# Patient Record
Sex: Male | Born: 1935 | Race: White | Hispanic: No | Smoking: Never smoker
Health system: Southern US, Community
[De-identification: ages and names within clinical notes are randomized; demographics above are authoritative.]

## PROBLEM LIST (undated history)

## (undated) DIAGNOSIS — H9190 Unspecified hearing loss, unspecified ear: Secondary | ICD-10-CM

## (undated) DIAGNOSIS — F0390 Unspecified dementia without behavioral disturbance: Secondary | ICD-10-CM

## (undated) DIAGNOSIS — N185 Chronic kidney disease, stage 5: Secondary | ICD-10-CM

## (undated) HISTORY — PX: EYE SURGERY: SHX253

---

## 2013-08-19 ENCOUNTER — Emergency Department (HOSPITAL_COMMUNITY): Payer: Medicare Other

## 2013-08-19 ENCOUNTER — Encounter (HOSPITAL_COMMUNITY): Payer: Self-pay | Admitting: Emergency Medicine

## 2013-08-19 ENCOUNTER — Emergency Department (INDEPENDENT_AMBULATORY_CARE_PROVIDER_SITE_OTHER)
Admission: EM | Admit: 2013-08-19 | Discharge: 2013-08-19 | Disposition: A | Discharge status: Home / Self Care | Payer: Medicare Other | Source: Home / Self Care | Attending: Emergency Medicine | Admitting: Emergency Medicine

## 2013-08-19 ENCOUNTER — Emergency Department (HOSPITAL_COMMUNITY)
Admission: EM | Admit: 2013-08-19 | Discharge: 2013-08-19 | Disposition: A | Discharge status: Home / Self Care | Payer: Medicare Other | Attending: Emergency Medicine | Admitting: Emergency Medicine

## 2013-08-19 DIAGNOSIS — N189 Chronic kidney disease, unspecified: Secondary | ICD-10-CM | POA: Insufficient documentation

## 2013-08-19 DIAGNOSIS — R799 Abnormal finding of blood chemistry, unspecified: Secondary | ICD-10-CM

## 2013-08-19 DIAGNOSIS — R7989 Other specified abnormal findings of blood chemistry: Secondary | ICD-10-CM

## 2013-08-19 DIAGNOSIS — R42 Dizziness and giddiness: Secondary | ICD-10-CM

## 2013-08-19 DIAGNOSIS — Z79899 Other long term (current) drug therapy: Secondary | ICD-10-CM | POA: Insufficient documentation

## 2013-08-19 LAB — CBC WITH DIFFERENTIAL/PLATELET
BASOS ABS: 0 10*3/uL (ref 0.0–0.1)
BASOS PCT: 0 % (ref 0–1)
EOS ABS: 0.1 10*3/uL (ref 0.0–0.7)
EOS PCT: 1 % (ref 0–5)
HCT: 41.5 % (ref 39.0–52.0)
Hemoglobin: 13.6 g/dL (ref 13.0–17.0)
Lymphocytes Relative: 14 % (ref 12–46)
Lymphs Abs: 1.1 10*3/uL (ref 0.7–4.0)
MCH: 31.6 pg (ref 26.0–34.0)
MCHC: 32.8 g/dL (ref 30.0–36.0)
MCV: 96.3 fL (ref 78.0–100.0)
Monocytes Absolute: 0.6 10*3/uL (ref 0.1–1.0)
Monocytes Relative: 7 % (ref 3–12)
NEUTROS PCT: 78 % — AB (ref 43–77)
Neutro Abs: 5.9 10*3/uL (ref 1.7–7.7)
PLATELETS: 202 10*3/uL (ref 150–400)
RBC: 4.31 MIL/uL (ref 4.22–5.81)
RDW: 12.5 % (ref 11.5–15.5)
WBC: 7.6 10*3/uL (ref 4.0–10.5)

## 2013-08-19 LAB — URINALYSIS, ROUTINE W REFLEX MICROSCOPIC
Bilirubin Urine: NEGATIVE
GLUCOSE, UA: NEGATIVE mg/dL
HGB URINE DIPSTICK: NEGATIVE
KETONES UR: NEGATIVE mg/dL
LEUKOCYTES UA: NEGATIVE
Nitrite: NEGATIVE
PROTEIN: NEGATIVE mg/dL
Specific Gravity, Urine: 1.012 (ref 1.005–1.030)
Urobilinogen, UA: 0.2 mg/dL (ref 0.0–1.0)
pH: 7 (ref 5.0–8.0)

## 2013-08-19 LAB — BASIC METABOLIC PANEL
BUN: 39 mg/dL — ABNORMAL HIGH (ref 6–23)
CO2: 28 mEq/L (ref 19–32)
Calcium: 9.6 mg/dL (ref 8.4–10.5)
Chloride: 102 mEq/L (ref 96–112)
Creatinine, Ser: 2.22 mg/dL — ABNORMAL HIGH (ref 0.50–1.35)
GFR, EST AFRICAN AMERICAN: 29 mL/min — AB (ref >90)
GFR, EST NON AFRICAN AMERICAN: 25 mL/min — AB (ref >90)
Glucose, Bld: 116 mg/dL — ABNORMAL HIGH (ref 70–99)
POTASSIUM: 5.2 meq/L (ref 3.7–5.3)
SODIUM: 142 meq/L (ref 137–147)

## 2013-08-19 LAB — TROPONIN I: Troponin I: <0.3 ng/mL (ref <0.30)

## 2013-08-19 LAB — POCT I-STAT, CHEM 8
BUN: 37 mg/dL — AB (ref 6–23)
CALCIUM ION: 1.2 mmol/L (ref 1.13–1.30)
CREATININE: 2.4 mg/dL — AB (ref 0.50–1.35)
Chloride: 103 mEq/L (ref 96–112)
GLUCOSE: 131 mg/dL — AB (ref 70–99)
HCT: 47 % (ref 39.0–52.0)
HEMOGLOBIN: 16 g/dL (ref 13.0–17.0)
Potassium: 4.5 mEq/L (ref 3.7–5.3)
Sodium: 144 mEq/L (ref 137–147)
TCO2: 29 mmol/L (ref 0–100)

## 2013-08-19 NOTE — ED Notes (Signed)
Pt c/o dizzy onset 1 week Denies cold sx, f/v/n/d, SOB, weakness, CP Voices no other concerns Alert and talking in complete sentences w/no signs of acute distress.

## 2013-08-19 NOTE — Discharge Instructions (Signed)
Dizziness Dizziness is a common problem. It is a feeling of unsteadiness or lightheadedness. You may feel like you are about to faint. Dizziness can lead to injury if you stumble or fall. A person of any age group can suffer from dizziness, but dizziness is more common in older adults. CAUSES  Dizziness can be caused by many different things, including:  Middle ear problems.  Standing for too long.  Infections.  An allergic reaction.  Aging.  An emotional response to something, such as the sight of blood.  Side effects of medicines.  Fatigue.  Problems with circulation or blood pressure.  Excess use of alcohol, medicines, or illegal drug use.  Breathing too fast (hyperventilation).  An arrhythmia or problems with your heart rhythm.  Low red blood cell count (anemia).  Pregnancy.  Vomiting, diarrhea, fever, or other illnesses that cause dehydration.  Diseases or conditions such as Parkinson's disease, high blood pressure (hypertension), diabetes, and thyroid problems.  Exposure to extreme heat. DIAGNOSIS  To find the cause of your dizziness, your caregiver may do a physical exam, lab tests, radiologic imaging scans, or an electrocardiography test (ECG).  TREATMENT  Treatment of dizziness depends on the cause of your symptoms and can vary greatly. HOME CARE INSTRUCTIONS   Drink enough fluids to keep your urine clear or pale yellow. This is especially important in very hot weather. In the elderly, it is also important in cold weather.  If your dizziness is caused by medicines, take them exactly as directed. When taking blood pressure medicines, it is especially important to get up slowly.  Rise slowly from chairs and steady yourself until you feel okay.  In the morning, first sit up on the side of the bed. When this seems okay, stand slowly while holding onto something until you know your balance is fine.  If you need to stand in one place for a long time, be sure to  move your legs often. Tighten and relax the muscles in your legs while standing.  If dizziness continues to be a problem, have someone stay with you for a day or two. Do this until you feel you are well enough to stay alone. Have the person call your caregiver if he or she notices changes in you that are concerning.  Do not drive or use heavy machinery if you feel dizzy.  Do not drink alcohol. SEEK IMMEDIATE MEDICAL CARE IF:   Your dizziness or lightheadedness gets worse.  You feel nauseous or vomit.  You develop problems with talking, walking, weakness, or using your arms, hands, or legs.  You are not thinking clearly or you have difficulty forming sentences. It may take a friend or family member to determine if your thinking is normal.  You develop chest pain, abdominal pain, shortness of breath, or sweating.  Your vision changes.  You notice any bleeding.  You have side effects from medicine that seems to be getting worse rather than better. MAKE SURE YOU:   Understand these instructions.  Will watch your condition.  Will get help right away if you are not doing well or get worse. Document Released: 09/16/2000 Document Revised: 06/15/2011 Document Reviewed: 10/10/2010 [facility] Patient Information 2014 [place], [place].  Benign Positional Vertigo Vertigo means you feel like you or your surroundings are moving when they are not. Benign positional vertigo is the most common form of vertigo. Benign means that the cause of your condition is not serious. Benign positional vertigo is more common in older adults. CAUSES  Benign positional vertigo is the result of an upset in the labyrinth system. This is an area in the middle ear that helps control your balance. This may be caused by a viral infection, head injury, or repetitive motion. However, often no specific cause is found. SYMPTOMS  Symptoms of benign positional vertigo occur when you move your head or eyes in different  directions. Some of the symptoms may include:  Loss of balance and falls.  Vomiting.  Blurred vision.  Dizziness.  Nausea.  Involuntary eye movements (nystagmus). DIAGNOSIS  Benign positional vertigo is usually diagnosed by physical exam. If the specific cause of your benign positional vertigo is unknown, your caregiver may perform imaging tests, such as magnetic resonance imaging (MRI) or computed tomography (CT). TREATMENT  Your caregiver may recommend movements or procedures to correct the benign positional vertigo. Medicines such as meclizine, benzodiazepines, and medicines for nausea may be used to treat your symptoms. In rare cases, if your symptoms are caused by certain conditions that affect the inner ear, you may need surgery. HOME CARE INSTRUCTIONS   Follow your caregiver's instructions.  Move slowly. Do not make sudden body or head movements.  Avoid driving.  Avoid operating heavy machinery.  Avoid performing any tasks that would be dangerous to you or others during a vertigo episode.  Drink enough fluids to keep your urine clear or pale yellow. SEEK IMMEDIATE MEDICAL CARE IF:   You develop problems with walking, weakness, numbness, or using your arms, hands, or legs.  You have difficulty speaking.  You develop severe headaches.  Your nausea or vomiting continues or gets worse.  You develop visual changes.  Your family or friends notice any behavioral changes.  Your condition gets worse.  You have a fever.  You develop a stiff neck or sensitivity to light. MAKE SURE YOU:   Understand these instructions.  Will watch your condition.  Will get help right away if you are not doing well or get worse. Document Released: 12/29/2005 Document Revised: 06/15/2011 Document Reviewed: 12/11/2010 [facility] Patient Information 2014 [place].  Chronic Kidney Disease Chronic kidney disease occurs when the kidneys are damaged over a long period. The kidneys  are two organs that lie on either side of the spine between the middle of the back and the front of the abdomen. The kidneys:   Remove wastes and extra water from the blood.   Produce important hormones. These help keep bones strong, regulate blood pressure, and help create red blood cells.   Balance the fluids and chemicals in the blood and tissues. A small amount of kidney damage may not cause problems, but a large amount of damage may make it difficult or impossible for the kidneys to work the way they should. If steps are not taken to slow down the kidney damage or stop it from getting worse, the kidneys may stop working permanently. Most of the time, chronic kidney disease does not go away. However, it can often be controlled, and those with the disease can usually live normal lives. CAUSES  The most common causes of chronic kidney disease are diabetes and high blood pressure (hypertension). Chronic kidney disease may also be caused by:   Diseases that cause kidneys' filters to become inflamed.   Diseases that affect the immune system.   Genetic diseases.   Medicines that damage the kidneys, such as anti-inflammatory medicines.  Poisoning or exposure to toxic substances.   A reoccurring kidney or urinary infection.   A problem with urine  flow. This may be caused by:   Cancer.   Kidney stones.   An enlarged prostate in males. SYMPTOMS  Because the kidney damage in chronic kidney disease occurs slowly, symptoms develop slowly and may not be obvious until the kidney damage becomes severe. A person may have a kidney disease for years without showing any symptoms. Symptoms can include:   Swelling (edema) of the legs, ankles, or feet.   Tiredness (lethargy).   Nausea or vomiting.   Confusion.   Problems with urination, such as:   Decreased urine production.   Frequent urination, especially at night.   Frequent accidents in children who are potty  trained.   Muscle twitches and cramps.   Shortness of breath.  Weakness.   Persistent itchiness.   Loss of appetite.  Metallic taste in the mouth.  Trouble sleeping.  Slowed development in children.  Short stature in children. DIAGNOSIS  Chronic kidney disease may be detected and diagnosed by tests, including blood, urine, imaging, or kidney biopsy tests.  TREATMENT  Most chronic kidney diseases cannot be cured. Treatment usually involves relieving symptoms and preventing or slowing the progression of the disease. Treatment may include:   A special diet. You may need to avoid alcohol and foods thatare salty and high in potassium.   Medicines. These may:   Lower blood pressure.   Relieve anemia.   Relieve swelling.   Protect the bones. HOME CARE INSTRUCTIONS   Follow your prescribed diet.   Only take over-the-counter or prescription medicines as directed by your caregiver.  Do not take any new medicines (prescription, over-the-counter, or nutritional supplements) unless approved by your caregiver. Many medicines can worsen your kidney damage or need to have the dose adjusted.   Quit smoking if you are a smoker. Talk to your caregiver about a smoking cessation program.   Keep all follow-up appointments as directed by your caregiver. SEEK IMMEDIATE MEDICAL CARE IF:  Your symptoms get worse or you develop new symptoms.   You develop symptoms of end-stage kidney disease. These include:   Headaches.   Abnormally dark or light skin.   Numbness in the hands or feet.   Easy bruising.   Frequent hiccups.   Menstruation stops.   You have a fever.   You have decreased urine production.   You havepain or bleeding when urinating. MAKE SURE YOU:  Understand these instructions.  Will watch your condition.  Will get help right away if you are not doing well or get worse. FOR MORE INFORMATION  American Association of Kidney  Patients: BombTimer.gl National Kidney Foundation: www.kidney.[place]: https://mathis.com/ [facility]: www.lifeoptions.org and www.kidneyschool.org Document Released: 12/31/2007 Document Revised: 03/09/2012 Document Reviewed: 11/20/2011 [facility] Patient Information 2014 [place], [place].

## 2013-08-19 NOTE — ED Provider Notes (Signed)
CSN: [id number]     Arrival date & time 08/19/13  1229 History   First MD Initiated Contact with Patient 08/19/13 1315     Chief Complaint  Patient presents with  . Dizziness     (Consider location/radiation/quality/duration/timing/severity/associated sxs/prior Treatment) HPI Comments: Patient presents to the ER for evaluation of dizziness. Patient reports that he had onset of dizziness earlier this week when he was at the dentist office. He reports that he was cleaned back to have his teeth worked on when he noticed he was feeling woozy and lightheaded. Since then he has been having intermittent episodes. He does not have a headache. There has not been any syncope. He denies chest pain, palpitations, shortness of breath. Patient seen in urgent care for these symptoms, referred to the ER for further evaluation.  Patient is a 80 y.o. male presenting with dizziness.  Dizziness Associated symptoms: no headaches     History reviewed. No pertinent past medical history. History reviewed. No pertinent past surgical history. History reviewed. No pertinent family history. History  Substance Use Topics  . Smoking status: Never Smoker   . Smokeless tobacco: Not on file  . Alcohol Use: No    Review of Systems  Respiratory: Negative.   Cardiovascular: Negative.   Neurological: Positive for dizziness and light-headedness. Negative for syncope, speech difficulty, weakness and headaches.  All other systems reviewed and are negative.     Allergies  Review of patient's allergies indicates no known allergies.  Home Medications   Prior to Admission medications   Medication Sig Start Date End Date Taking? Authorizing Provider  amLODipine (NORVASC) 10 MG tablet Take 10 mg by mouth daily.    Historical Provider, MD  furosemide (LASIX) 20 MG tablet Take 20 mg by mouth.    Historical Provider, MD  hydrALAZINE (APRESOLINE) 25 MG tablet Take 25 mg by mouth 3 (three) times daily.    Historical  Provider, MD  metoprolol tartrate (LOPRESSOR) 25 MG tablet Take 25 mg by mouth 2 (two) times daily.    Historical Provider, MD  olmesartan (BENICAR) 40 MG tablet Take 40 mg by mouth daily.    Historical Provider, MD   BP 107/64  Pulse 56  Temp(Src) 97.6 F (36.4 C)  Resp 15  Ht 5\' 8"  (1.727 m)  Wt 150 lb 4 oz (68.153 kg)  BMI 22.85 kg/m2  SpO2 92% Physical Exam  Constitutional: He is oriented to person, place, and time. He appears well-developed and well-nourished. No distress.  HENT:  Head: Normocephalic and atraumatic.  Right Ear: Hearing normal.  Left Ear: Hearing normal.  Nose: Nose normal.  Mouth/Throat: Oropharynx is clear and moist and mucous membranes are normal.  Eyes: Conjunctivae and EOM are normal. Pupils are equal, round, and reactive to light.  Neck: Normal range of motion. Neck supple.  Cardiovascular: Regular rhythm, S1 normal and S2 normal.  Exam reveals no gallop and no friction rub.   No murmur heard. Pulmonary/Chest: Effort normal and breath sounds normal. No respiratory distress. He exhibits no tenderness.  Abdominal: Soft. Normal appearance and bowel sounds are normal. There is no hepatosplenomegaly. There is no tenderness. There is no rebound, no guarding, no tenderness at McBurney's point and negative Murphy's sign. No hernia.  Musculoskeletal: Normal range of motion.  Neurological: He is alert and oriented to person, place, and time. He has normal strength. No cranial nerve deficit or sensory deficit. Coordination normal. GCS eye subscore is 4. GCS verbal subscore is 5. GCS motor subscore is  6.  Strength 5 out of 5, symmetric in upper extremities No pronator drift Finger-to-nose bilaterally Strength 5 out of 5, symmetric in both lower extremities Normal heel-to-shin   Skin: Skin is warm, dry and intact. No rash noted. No cyanosis.  Psychiatric: He has a normal mood and affect. His speech is normal and behavior is normal. Thought content normal.    ED  Course  Procedures (including critical care time) Labs Review Labs Reviewed  CBC WITH DIFFERENTIAL - Abnormal; Notable for the following:    Neutrophils Relative % 78 (*)    All other components within normal limits  BASIC METABOLIC PANEL - Abnormal; Notable for the following:    Glucose, Bld 116 (*)    BUN 39 (*)    Creatinine, Ser 2.22 (*)    GFR calc non Af Amer 25 (*)    GFR calc Af Amer 29 (*)    All other components within normal limits  TROPONIN I  URINALYSIS, ROUTINE W REFLEX MICROSCOPIC    Imaging Review Ct Head Wo Contrast  08/19/2013   CLINICAL DATA:  Progressive dizziness  EXAM: CT HEAD WITHOUT CONTRAST  TECHNIQUE: Contiguous axial images were obtained from the base of the skull through the vertex without intravenous contrast. Study was obtained within 24 hr of patient's arrival at the emergency department.  COMPARISON:  None.  FINDINGS: There is mild diffuse atrophy. There is no mass, hemorrhage, extra-axial fluid collection, or midline shift. Gray-white compartments appear normal. There is no demonstrable acute infarct. Bony calvarium appears intact. The mastoid air cells are clear.  IMPRESSION: Mild generalized atrophy. No intracranial mass, hemorrhage, or acute appearing infarct.   Electronically Signed   By: Lowella Grip M.D.   On: 08/19/2013 14:09     EKG Interpretation   Date/Time:  Saturday Aug 19 2013 12:46:14 EDT Ventricular Rate:  71 PR Interval:  146 QRS Duration: 84 QT Interval:  400 QTC Calculation: 434 R Axis:   59 Text Interpretation:  Normal sinus rhythm Normal ECG Confirmed by POLLINA   MD, CHRISTOPHER [phone]) on 08/19/2013 1:16:02 PM      MDM   Final diagnoses:  Chronic kidney disease  Dizziness   Referred to ER for evaluation of dizziness as well as elevated creatinine. Patient seen in urgent care prior to arrival. Patient had onset of dizziness after being treated at the dentist. It sounds like he essentially had a Dix-Hallpike maneuver  while lying in the dentist chair, has had intermittent dizziness ever since. His neurologic examination, however, is normal. CT head was unremarkable. Patient is ambulating here in the ER without difficulty and is symptom-free currently. This can be monitored as an outpatient.  As far as his elevated creatinine, patient reports a history of chronic kidney disease, sees a nephrologist for this. His baseline is unknown. Patient will be given contact his nephrologist Monday with these numbers.    Orpah Greek, MD 08/19/13 [phone]

## 2013-08-19 NOTE — ED Provider Notes (Signed)
CSN: [id number]     Arrival date & time 08/19/13  1016 History   First MD Initiated Contact with Patient 08/19/13 1052     Chief Complaint  Patient presents with  . Dizziness   (Consider location/radiation/quality/duration/timing/severity/associated sxs/prior Treatment) HPI Comments: 80 year old male presents for evaluation of dizziness. For one week he has been feeling lightheaded. It seems to be worse when he lays down flat and is somewhat relieved by sitting upright. He denies any feeling of vertigo, he just says it feels like he will pass out. He denies any associated symptoms. He has never had this before. His only medical problem is high blood pressure. No chest pain or shortness of breath, no leg swelling, no dark stools, no hematuria, no pain anywhere.  Patient is a 80 y.o. male presenting with dizziness.  Dizziness   History reviewed. No pertinent past medical history. History reviewed. No pertinent past surgical history. No family history on file. History  Substance Use Topics  . Smoking status: Never Smoker   . Smokeless tobacco: Not on file  . Alcohol Use: No    Review of Systems  Neurological: Positive for dizziness.  All other systems reviewed and are negative.   Allergies  Review of patient's allergies indicates no known allergies.  Home Medications   Prior to Admission medications   Medication Sig Start Date End Date Taking? Authorizing Provider  amLODipine (NORVASC) 10 MG tablet Take 10 mg by mouth daily.   Yes Historical Provider, MD  furosemide (LASIX) 20 MG tablet Take 20 mg by mouth.   Yes Historical Provider, MD  hydrALAZINE (APRESOLINE) 25 MG tablet Take 25 mg by mouth 3 (three) times daily.   Yes Historical Provider, MD  metoprolol tartrate (LOPRESSOR) 25 MG tablet Take 25 mg by mouth 2 (two) times daily.   Yes Historical Provider, MD  olmesartan (BENICAR) 40 MG tablet Take 40 mg by mouth daily.   Yes Historical Provider, MD   BP 150/47  Pulse 69   Temp(Src) 97.8 F (36.6 C) (Oral)  Resp 18  SpO2 98% Physical Exam  Nursing note and vitals reviewed. Constitutional: He is oriented to person, place, and time. He appears well-developed and well-nourished. No distress.  HENT:  Head: Normocephalic and atraumatic.  Right Ear: External ear normal.  Left Ear: External ear normal.  Nose: Nose normal.  Mouth/Throat: Oropharynx is clear and moist. No oropharyngeal exudate.  Eyes: Conjunctivae are normal. Right eye exhibits no discharge. Left eye exhibits no discharge.  Neck: Normal range of motion. Neck supple. No JVD present. No tracheal deviation present. No thyromegaly present.  Cardiovascular: Normal rate, regular rhythm and normal heart sounds.   Pulmonary/Chest: Effort normal and breath sounds normal. No respiratory distress.  Abdominal: Soft. Bowel sounds are normal. He exhibits no distension and no mass. There is no tenderness. There is no rebound and no guarding.  Lymphadenopathy:    He has no cervical adenopathy.  Neurological: He is alert and oriented to person, place, and time. Coordination normal.  Skin: Skin is warm and dry. No rash noted. He is not diaphoretic.  Psychiatric: He has a normal mood and affect. Judgment normal.    ED Course  Procedures (including critical care time) Labs Review Labs Reviewed  POCT I-STAT, CHEM 8 - Abnormal; Notable for the following:    BUN 37 (*)    Creatinine, Ser 2.40 (*)    Glucose, Bld 131 (*)    All other components within normal limits    Imaging  Review No results found.  EKG is normal  MDM   1. Dizziness   2. Elevated serum creatinine    Unsure of the exact cause of dizziness. Creatinine is elevated over baseline of 1.8, this may be causing dizziness or may be a byproduct of something else. Transferred to ED for further evaluation.    Liam Graham, PA-C 08/19/13 [phone]

## 2013-08-19 NOTE — ED Notes (Signed)
PT ambulated in hall unassisted and tol. Well the patient denies feeling dizzy.

## 2013-08-19 NOTE — ED Notes (Signed)
Per pt sts worsening dizziness over the past week. Pt denies pain. sts the dizziness is intermittent. Sent here from [facility] for further eval.

## 2013-08-21 NOTE — ED Provider Notes (Signed)
Medical screening examination/treatment/procedure(s) were performed by non-physician practitioner and as supervising physician I was immediately available for consultation/collaboration.  Philipp Deputy, M.D.  Harden Mo, MD 08/21/13 [phone]

## 2014-04-16 DIAGNOSIS — L309 Dermatitis, unspecified: Secondary | ICD-10-CM | POA: Diagnosis not present

## 2014-04-16 DIAGNOSIS — L570 Actinic keratosis: Secondary | ICD-10-CM | POA: Diagnosis not present

## 2014-04-16 DIAGNOSIS — B354 Tinea corporis: Secondary | ICD-10-CM | POA: Diagnosis not present

## 2014-05-08 DIAGNOSIS — B351 Tinea unguium: Secondary | ICD-10-CM | POA: Diagnosis not present

## 2014-05-08 DIAGNOSIS — I872 Venous insufficiency (chronic) (peripheral): Secondary | ICD-10-CM | POA: Diagnosis not present

## 2014-05-28 DIAGNOSIS — N183 Chronic kidney disease, stage 3 (moderate): Secondary | ICD-10-CM | POA: Diagnosis not present

## 2014-05-29 DIAGNOSIS — I872 Venous insufficiency (chronic) (peripheral): Secondary | ICD-10-CM | POA: Diagnosis not present

## 2014-05-30 DIAGNOSIS — M109 Gout, unspecified: Secondary | ICD-10-CM | POA: Diagnosis not present

## 2014-05-30 DIAGNOSIS — I129 Hypertensive chronic kidney disease with stage 1 through stage 4 chronic kidney disease, or unspecified chronic kidney disease: Secondary | ICD-10-CM | POA: Diagnosis not present

## 2014-05-30 DIAGNOSIS — N183 Chronic kidney disease, stage 3 (moderate): Secondary | ICD-10-CM | POA: Diagnosis not present

## 2014-07-25 DIAGNOSIS — H25813 Combined forms of age-related cataract, bilateral: Secondary | ICD-10-CM | POA: Diagnosis not present

## 2014-09-03 DIAGNOSIS — N183 Chronic kidney disease, stage 3 (moderate): Secondary | ICD-10-CM | POA: Diagnosis not present

## 2014-09-03 DIAGNOSIS — N189 Chronic kidney disease, unspecified: Secondary | ICD-10-CM | POA: Diagnosis not present

## 2014-09-05 DIAGNOSIS — M109 Gout, unspecified: Secondary | ICD-10-CM | POA: Diagnosis not present

## 2014-09-05 DIAGNOSIS — N183 Chronic kidney disease, stage 3 (moderate): Secondary | ICD-10-CM | POA: Diagnosis not present

## 2014-09-05 DIAGNOSIS — I129 Hypertensive chronic kidney disease with stage 1 through stage 4 chronic kidney disease, or unspecified chronic kidney disease: Secondary | ICD-10-CM | POA: Diagnosis not present

## 2014-09-05 DIAGNOSIS — M9080 Osteopathy in diseases classified elsewhere, unspecified site: Secondary | ICD-10-CM | POA: Diagnosis not present

## 2014-09-05 DIAGNOSIS — E559 Vitamin D deficiency, unspecified: Secondary | ICD-10-CM | POA: Diagnosis not present

## 2014-09-05 DIAGNOSIS — E889 Metabolic disorder, unspecified: Secondary | ICD-10-CM | POA: Diagnosis not present

## 2014-09-10 DIAGNOSIS — H2511 Age-related nuclear cataract, right eye: Secondary | ICD-10-CM | POA: Diagnosis not present

## 2014-09-10 DIAGNOSIS — H2512 Age-related nuclear cataract, left eye: Secondary | ICD-10-CM | POA: Diagnosis not present

## 2014-09-10 DIAGNOSIS — H25011 Cortical age-related cataract, right eye: Secondary | ICD-10-CM | POA: Diagnosis not present

## 2014-09-10 DIAGNOSIS — H25041 Posterior subcapsular polar age-related cataract, right eye: Secondary | ICD-10-CM | POA: Diagnosis not present

## 2014-09-10 DIAGNOSIS — H2589 Other age-related cataract: Secondary | ICD-10-CM | POA: Diagnosis not present

## 2014-11-28 DIAGNOSIS — H903 Sensorineural hearing loss, bilateral: Secondary | ICD-10-CM | POA: Diagnosis not present

## 2014-12-10 DIAGNOSIS — N183 Chronic kidney disease, stage 3 (moderate): Secondary | ICD-10-CM | POA: Diagnosis not present

## 2014-12-11 DIAGNOSIS — N183 Chronic kidney disease, stage 3 (moderate): Secondary | ICD-10-CM | POA: Diagnosis not present

## 2014-12-26 DIAGNOSIS — I129 Hypertensive chronic kidney disease with stage 1 through stage 4 chronic kidney disease, or unspecified chronic kidney disease: Secondary | ICD-10-CM | POA: Diagnosis not present

## 2014-12-26 DIAGNOSIS — D631 Anemia in chronic kidney disease: Secondary | ICD-10-CM | POA: Diagnosis not present

## 2014-12-26 DIAGNOSIS — M9080 Osteopathy in diseases classified elsewhere, unspecified site: Secondary | ICD-10-CM | POA: Diagnosis not present

## 2014-12-26 DIAGNOSIS — E559 Vitamin D deficiency, unspecified: Secondary | ICD-10-CM | POA: Diagnosis not present

## 2014-12-26 DIAGNOSIS — E889 Metabolic disorder, unspecified: Secondary | ICD-10-CM | POA: Diagnosis not present

## 2014-12-26 DIAGNOSIS — N183 Chronic kidney disease, stage 3 (moderate): Secondary | ICD-10-CM | POA: Diagnosis not present

## 2015-03-11 DIAGNOSIS — H2511 Age-related nuclear cataract, right eye: Secondary | ICD-10-CM | POA: Diagnosis not present

## 2015-03-11 DIAGNOSIS — H25041 Posterior subcapsular polar age-related cataract, right eye: Secondary | ICD-10-CM | POA: Diagnosis not present

## 2015-03-11 DIAGNOSIS — H25011 Cortical age-related cataract, right eye: Secondary | ICD-10-CM | POA: Diagnosis not present

## 2015-03-11 DIAGNOSIS — H401430 Capsular glaucoma with pseudoexfoliation of lens, bilateral, stage unspecified: Secondary | ICD-10-CM | POA: Diagnosis not present

## 2015-03-25 DIAGNOSIS — M109 Gout, unspecified: Secondary | ICD-10-CM | POA: Diagnosis not present

## 2015-03-25 DIAGNOSIS — N183 Chronic kidney disease, stage 3 (moderate): Secondary | ICD-10-CM | POA: Diagnosis not present

## 2015-03-28 DIAGNOSIS — M9080 Osteopathy in diseases classified elsewhere, unspecified site: Secondary | ICD-10-CM | POA: Diagnosis not present

## 2015-03-28 DIAGNOSIS — M109 Gout, unspecified: Secondary | ICD-10-CM | POA: Diagnosis not present

## 2015-03-28 DIAGNOSIS — H1031 Unspecified acute conjunctivitis, right eye: Secondary | ICD-10-CM | POA: Diagnosis not present

## 2015-03-28 DIAGNOSIS — I129 Hypertensive chronic kidney disease with stage 1 through stage 4 chronic kidney disease, or unspecified chronic kidney disease: Secondary | ICD-10-CM | POA: Diagnosis not present

## 2015-03-28 DIAGNOSIS — E559 Vitamin D deficiency, unspecified: Secondary | ICD-10-CM | POA: Diagnosis not present

## 2015-03-28 DIAGNOSIS — E889 Metabolic disorder, unspecified: Secondary | ICD-10-CM | POA: Diagnosis not present

## 2015-03-28 DIAGNOSIS — N183 Chronic kidney disease, stage 3 (moderate): Secondary | ICD-10-CM | POA: Diagnosis not present

## 2015-06-24 DIAGNOSIS — N183 Chronic kidney disease, stage 3 (moderate): Secondary | ICD-10-CM | POA: Diagnosis not present

## 2015-06-27 DIAGNOSIS — E559 Vitamin D deficiency, unspecified: Secondary | ICD-10-CM | POA: Diagnosis not present

## 2015-06-27 DIAGNOSIS — E875 Hyperkalemia: Secondary | ICD-10-CM | POA: Diagnosis not present

## 2015-06-27 DIAGNOSIS — M1000 Idiopathic gout, unspecified site: Secondary | ICD-10-CM | POA: Diagnosis not present

## 2015-06-27 DIAGNOSIS — I129 Hypertensive chronic kidney disease with stage 1 through stage 4 chronic kidney disease, or unspecified chronic kidney disease: Secondary | ICD-10-CM | POA: Diagnosis not present

## 2015-06-27 DIAGNOSIS — E889 Metabolic disorder, unspecified: Secondary | ICD-10-CM | POA: Diagnosis not present

## 2015-06-27 DIAGNOSIS — M9080 Osteopathy in diseases classified elsewhere, unspecified site: Secondary | ICD-10-CM | POA: Diagnosis not present

## 2015-06-27 DIAGNOSIS — N183 Chronic kidney disease, stage 3 (moderate): Secondary | ICD-10-CM | POA: Diagnosis not present

## 2015-07-08 DIAGNOSIS — D0439 Carcinoma in situ of skin of other parts of face: Secondary | ICD-10-CM | POA: Diagnosis not present

## 2015-07-08 DIAGNOSIS — C4441 Basal cell carcinoma of skin of scalp and neck: Secondary | ICD-10-CM | POA: Diagnosis not present

## 2015-07-08 DIAGNOSIS — I872 Venous insufficiency (chronic) (peripheral): Secondary | ICD-10-CM | POA: Diagnosis not present

## 2015-07-08 DIAGNOSIS — L570 Actinic keratosis: Secondary | ICD-10-CM | POA: Diagnosis not present

## 2015-07-15 DIAGNOSIS — H25011 Cortical age-related cataract, right eye: Secondary | ICD-10-CM | POA: Diagnosis not present

## 2015-07-15 DIAGNOSIS — H401430 Capsular glaucoma with pseudoexfoliation of lens, bilateral, stage unspecified: Secondary | ICD-10-CM | POA: Diagnosis not present

## 2015-07-15 DIAGNOSIS — H2511 Age-related nuclear cataract, right eye: Secondary | ICD-10-CM | POA: Diagnosis not present

## 2015-07-15 DIAGNOSIS — H25041 Posterior subcapsular polar age-related cataract, right eye: Secondary | ICD-10-CM | POA: Diagnosis not present

## 2015-08-05 DIAGNOSIS — E875 Hyperkalemia: Secondary | ICD-10-CM | POA: Diagnosis not present

## 2015-08-08 DIAGNOSIS — M109 Gout, unspecified: Secondary | ICD-10-CM | POA: Diagnosis not present

## 2015-08-08 DIAGNOSIS — E889 Metabolic disorder, unspecified: Secondary | ICD-10-CM | POA: Diagnosis not present

## 2015-08-08 DIAGNOSIS — E875 Hyperkalemia: Secondary | ICD-10-CM | POA: Diagnosis not present

## 2015-08-08 DIAGNOSIS — M9080 Osteopathy in diseases classified elsewhere, unspecified site: Secondary | ICD-10-CM | POA: Diagnosis not present

## 2015-08-08 DIAGNOSIS — N183 Chronic kidney disease, stage 3 (moderate): Secondary | ICD-10-CM | POA: Diagnosis not present

## 2015-08-08 DIAGNOSIS — I129 Hypertensive chronic kidney disease with stage 1 through stage 4 chronic kidney disease, or unspecified chronic kidney disease: Secondary | ICD-10-CM | POA: Diagnosis not present

## 2015-08-08 DIAGNOSIS — E559 Vitamin D deficiency, unspecified: Secondary | ICD-10-CM | POA: Diagnosis not present

## 2015-08-15 DIAGNOSIS — D0439 Carcinoma in situ of skin of other parts of face: Secondary | ICD-10-CM | POA: Diagnosis not present

## 2015-08-15 DIAGNOSIS — C4441 Basal cell carcinoma of skin of scalp and neck: Secondary | ICD-10-CM | POA: Diagnosis not present

## 2015-08-15 IMAGING — CT CT HEAD W/O CM
1 series · 16 of 27 positions shown, 20 images · non-contrast
Comparison: None.

CLINICAL DATA: Progressive dizziness

EXAM:
CT HEAD WITHOUT CONTRAST
TECHNIQUE: Contiguous axial images were obtained from the base of the skull
through the vertex without intravenous contrast. Study was obtained
within 24 hr of patient's arrival at the emergency department.

[Series 2: head 5.0 h30s · axial · 0.42mm/px · z∈[-167,-47]mm · 16 of 27 slices shown, 20 images]
[im 2/27  brain]
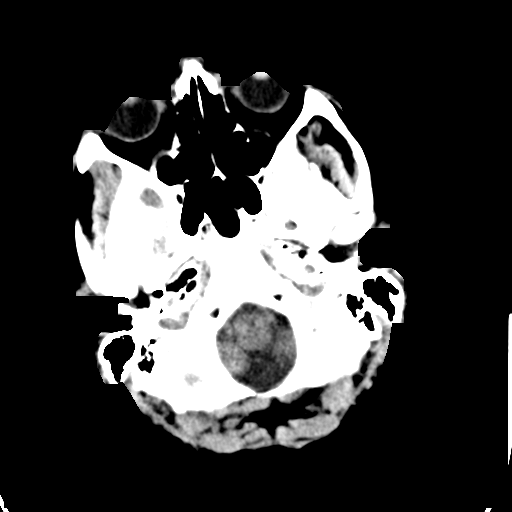
[im 2/27  bone]
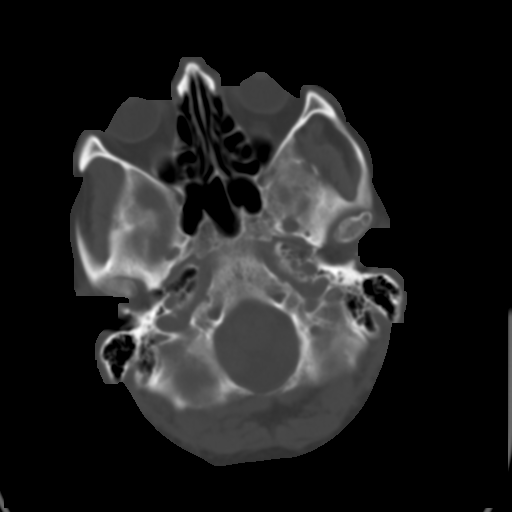
[im 4/27  brain]
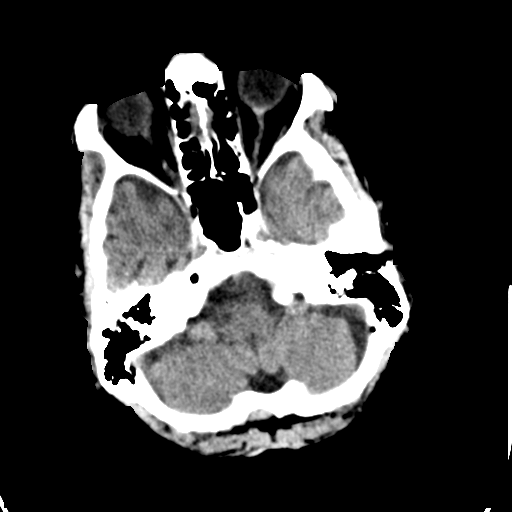
[im 5/27  brain]
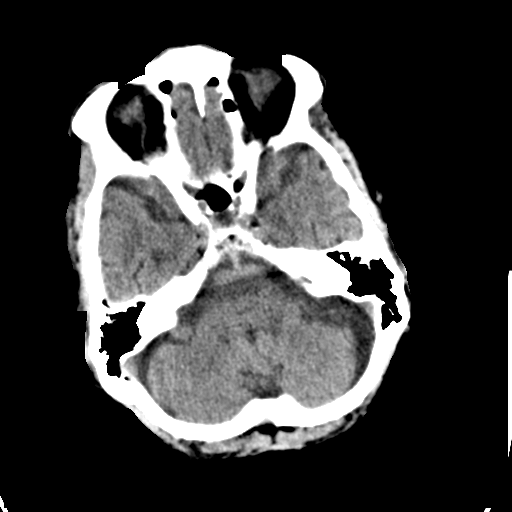
[im 7/27  brain]
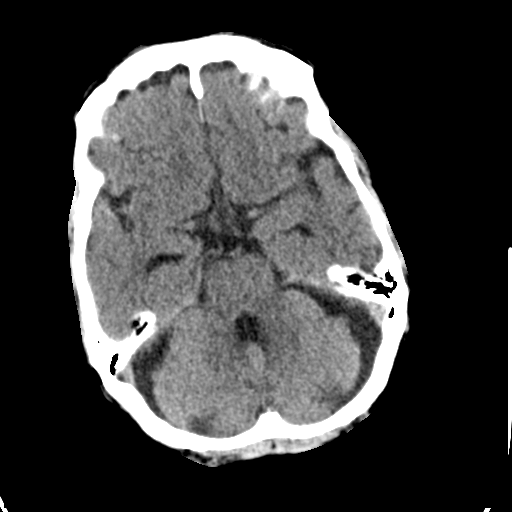
[im 9/27  brain]
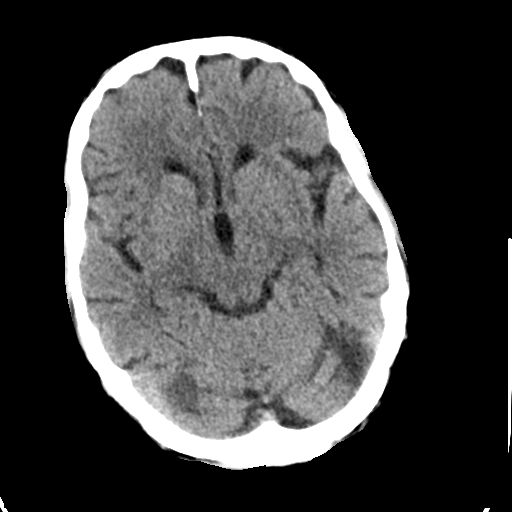
[im 9/27  bone]
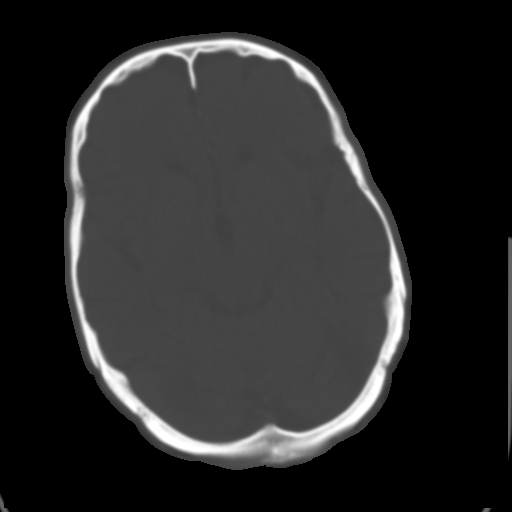
[im 10/27  brain]
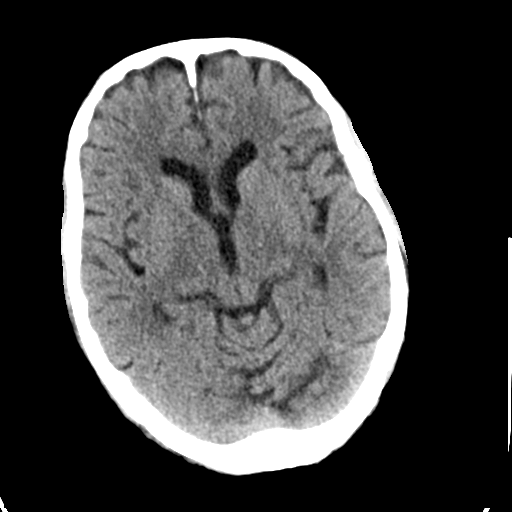
[im 12/27  brain]
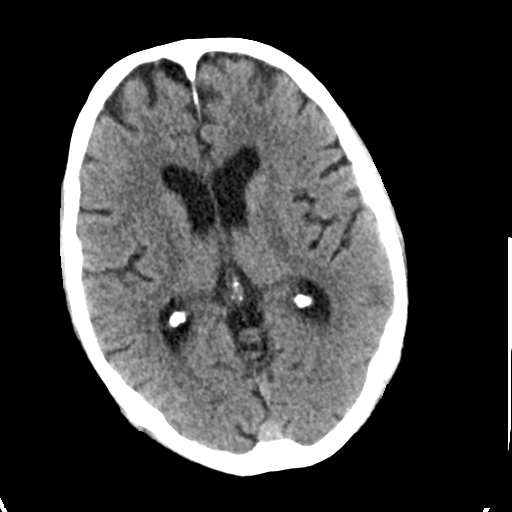
[im 13/27  brain]
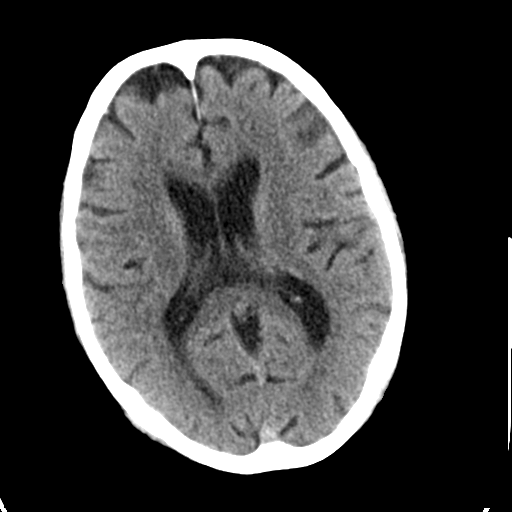
[im 15/27  brain]
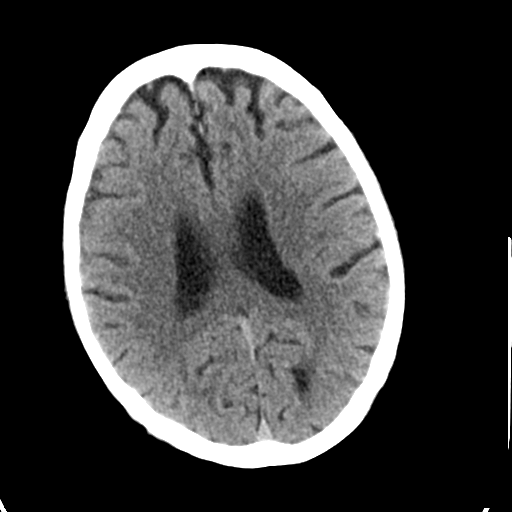
[im 15/27  bone]
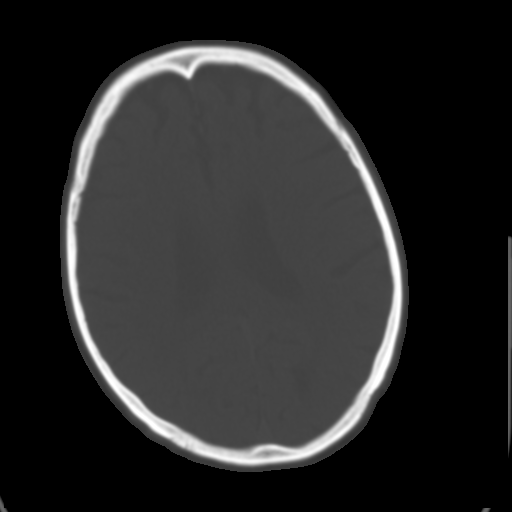
[im 16/27  brain]
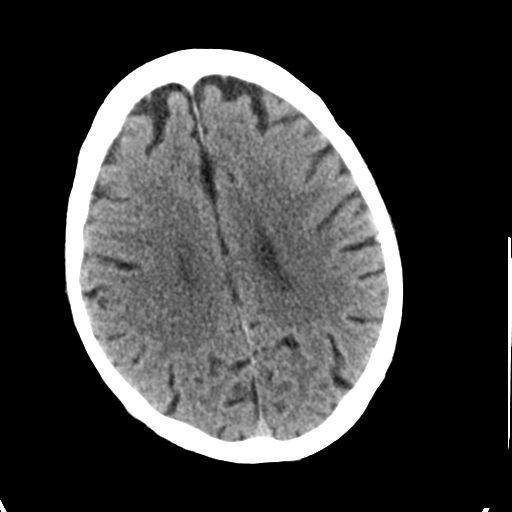
[im 18/27  brain]
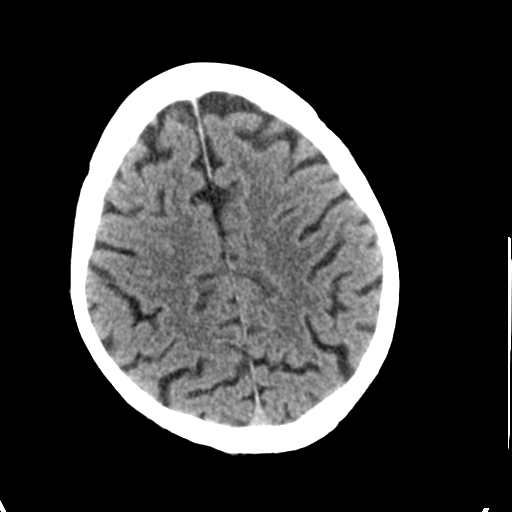
[im 19/27  brain]
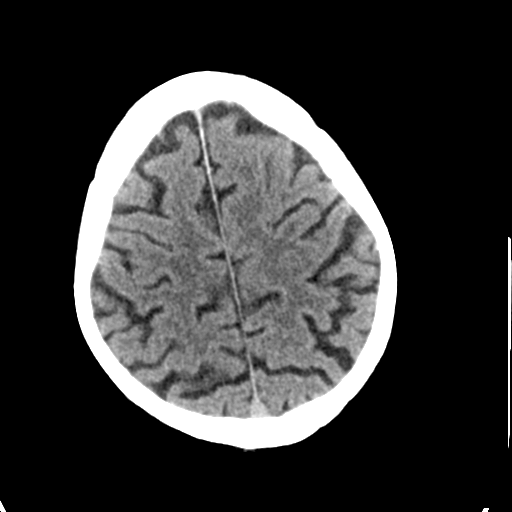
[im 21/27  brain]
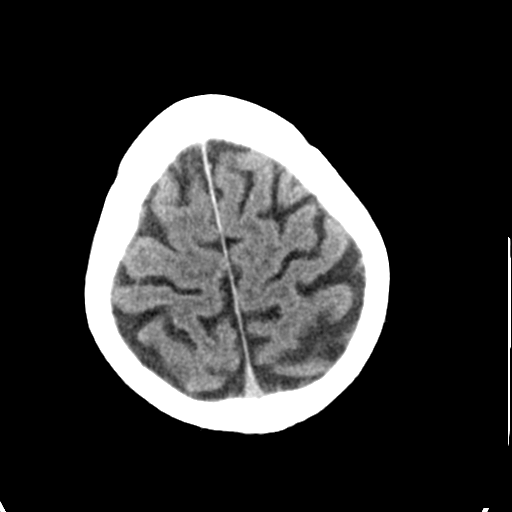
[im 21/27  bone]
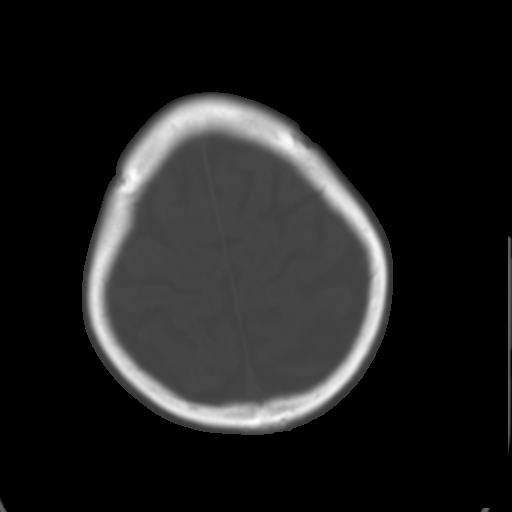
[im 23/27  brain]
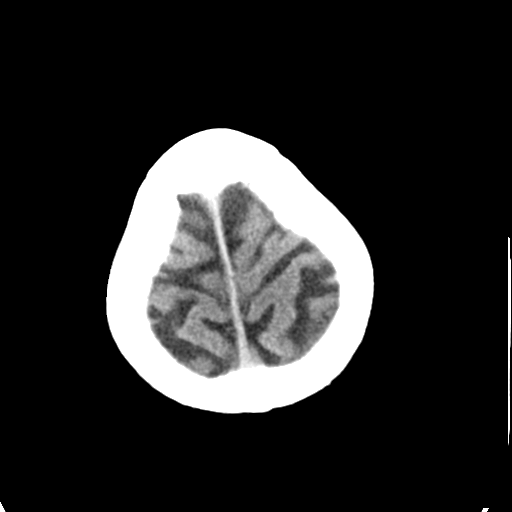
[im 24/27  brain]
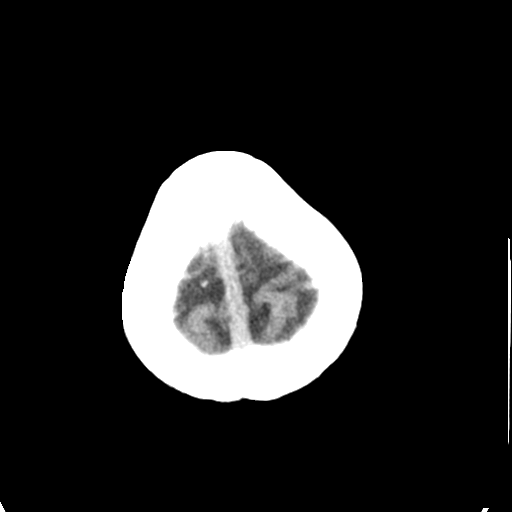
[im 26/27  brain]
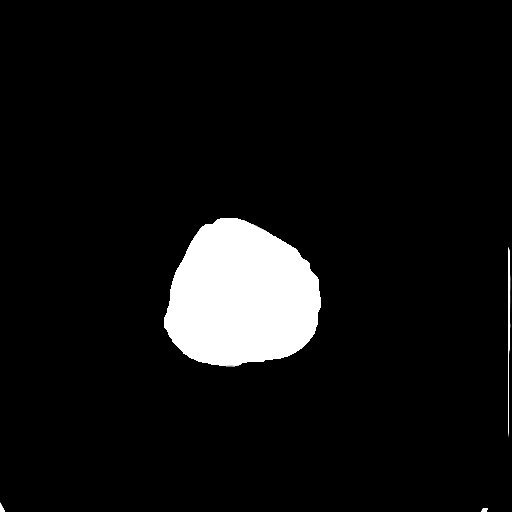

[16 of 27 positions shown; findings below may reference images not displayed]

FINDINGS: There is mild diffuse atrophy. There is no mass, hemorrhage,
extra-axial fluid collection, or midline shift. Gray-white
compartments appear normal. There is no demonstrable acute infarct.
Bony calvarium appears intact. The mastoid air cells are clear.
IMPRESSION: Mild generalized atrophy. No intracranial mass, hemorrhage, or acute
appearing infarct.

## 2015-11-11 DIAGNOSIS — N183 Chronic kidney disease, stage 3 (moderate): Secondary | ICD-10-CM | POA: Diagnosis not present

## 2015-11-11 DIAGNOSIS — I129 Hypertensive chronic kidney disease with stage 1 through stage 4 chronic kidney disease, or unspecified chronic kidney disease: Secondary | ICD-10-CM | POA: Diagnosis not present

## 2015-11-11 DIAGNOSIS — E889 Metabolic disorder, unspecified: Secondary | ICD-10-CM | POA: Diagnosis not present

## 2015-11-11 DIAGNOSIS — L570 Actinic keratosis: Secondary | ICD-10-CM | POA: Diagnosis not present

## 2015-11-11 DIAGNOSIS — D239 Other benign neoplasm of skin, unspecified: Secondary | ICD-10-CM | POA: Diagnosis not present

## 2015-11-11 DIAGNOSIS — M9080 Osteopathy in diseases classified elsewhere, unspecified site: Secondary | ICD-10-CM | POA: Diagnosis not present

## 2015-11-11 DIAGNOSIS — E559 Vitamin D deficiency, unspecified: Secondary | ICD-10-CM | POA: Diagnosis not present

## 2015-11-13 DIAGNOSIS — N183 Chronic kidney disease, stage 3 (moderate): Secondary | ICD-10-CM | POA: Diagnosis not present

## 2015-11-13 DIAGNOSIS — E875 Hyperkalemia: Secondary | ICD-10-CM | POA: Diagnosis not present

## 2015-11-13 DIAGNOSIS — E889 Metabolic disorder, unspecified: Secondary | ICD-10-CM | POA: Diagnosis not present

## 2015-11-13 DIAGNOSIS — M10379 Gout due to renal impairment, unspecified ankle and foot: Secondary | ICD-10-CM | POA: Diagnosis not present

## 2015-11-13 DIAGNOSIS — I129 Hypertensive chronic kidney disease with stage 1 through stage 4 chronic kidney disease, or unspecified chronic kidney disease: Secondary | ICD-10-CM | POA: Diagnosis not present

## 2015-11-13 DIAGNOSIS — M9080 Osteopathy in diseases classified elsewhere, unspecified site: Secondary | ICD-10-CM | POA: Diagnosis not present

## 2015-11-13 DIAGNOSIS — E559 Vitamin D deficiency, unspecified: Secondary | ICD-10-CM | POA: Diagnosis not present

## 2015-12-26 DIAGNOSIS — H04129 Dry eye syndrome of unspecified lacrimal gland: Secondary | ICD-10-CM | POA: Diagnosis not present

## 2015-12-26 DIAGNOSIS — H2511 Age-related nuclear cataract, right eye: Secondary | ICD-10-CM | POA: Diagnosis not present

## 2015-12-26 DIAGNOSIS — H401430 Capsular glaucoma with pseudoexfoliation of lens, bilateral, stage unspecified: Secondary | ICD-10-CM | POA: Diagnosis not present

## 2015-12-26 DIAGNOSIS — H401431 Capsular glaucoma with pseudoexfoliation of lens, bilateral, mild stage: Secondary | ICD-10-CM | POA: Diagnosis not present

## 2016-02-14 DIAGNOSIS — N183 Chronic kidney disease, stage 3 (moderate): Secondary | ICD-10-CM | POA: Diagnosis not present

## 2016-02-19 DIAGNOSIS — E559 Vitamin D deficiency, unspecified: Secondary | ICD-10-CM | POA: Diagnosis not present

## 2016-02-19 DIAGNOSIS — M9080 Osteopathy in diseases classified elsewhere, unspecified site: Secondary | ICD-10-CM | POA: Diagnosis not present

## 2016-02-19 DIAGNOSIS — E889 Metabolic disorder, unspecified: Secondary | ICD-10-CM | POA: Diagnosis not present

## 2016-02-19 DIAGNOSIS — M10379 Gout due to renal impairment, unspecified ankle and foot: Secondary | ICD-10-CM | POA: Diagnosis not present

## 2016-02-19 DIAGNOSIS — I129 Hypertensive chronic kidney disease with stage 1 through stage 4 chronic kidney disease, or unspecified chronic kidney disease: Secondary | ICD-10-CM | POA: Diagnosis not present

## 2016-02-19 DIAGNOSIS — N183 Chronic kidney disease, stage 3 (moderate): Secondary | ICD-10-CM | POA: Diagnosis not present

## 2016-02-19 DIAGNOSIS — E875 Hyperkalemia: Secondary | ICD-10-CM | POA: Diagnosis not present

## 2016-04-13 DIAGNOSIS — H25013 Cortical age-related cataract, bilateral: Secondary | ICD-10-CM | POA: Diagnosis not present

## 2016-04-13 DIAGNOSIS — H401422 Capsular glaucoma with pseudoexfoliation of lens, left eye, moderate stage: Secondary | ICD-10-CM | POA: Diagnosis not present

## 2016-04-13 DIAGNOSIS — H2513 Age-related nuclear cataract, bilateral: Secondary | ICD-10-CM | POA: Diagnosis not present

## 2016-04-13 DIAGNOSIS — H401413 Capsular glaucoma with pseudoexfoliation of lens, right eye, severe stage: Secondary | ICD-10-CM | POA: Diagnosis not present

## 2016-04-29 DIAGNOSIS — D492 Neoplasm of unspecified behavior of bone, soft tissue, and skin: Secondary | ICD-10-CM | POA: Diagnosis not present

## 2016-04-29 DIAGNOSIS — Z85828 Personal history of other malignant neoplasm of skin: Secondary | ICD-10-CM | POA: Diagnosis not present

## 2016-04-29 DIAGNOSIS — L570 Actinic keratosis: Secondary | ICD-10-CM | POA: Diagnosis not present

## 2016-06-01 DIAGNOSIS — H401422 Capsular glaucoma with pseudoexfoliation of lens, left eye, moderate stage: Secondary | ICD-10-CM | POA: Diagnosis not present

## 2016-06-01 DIAGNOSIS — H401413 Capsular glaucoma with pseudoexfoliation of lens, right eye, severe stage: Secondary | ICD-10-CM | POA: Diagnosis not present

## 2016-06-01 DIAGNOSIS — H2513 Age-related nuclear cataract, bilateral: Secondary | ICD-10-CM | POA: Diagnosis not present

## 2016-06-01 DIAGNOSIS — H25013 Cortical age-related cataract, bilateral: Secondary | ICD-10-CM | POA: Diagnosis not present

## 2016-07-08 ENCOUNTER — Ambulatory Visit (INDEPENDENT_AMBULATORY_CARE_PROVIDER_SITE_OTHER): Payer: Medicare Other | Admitting: Family Medicine

## 2016-07-08 VITALS — BP 112/53 | HR 68 | Temp 98.2°F | Resp 16 | Ht 68.0 in | Wt 149.6 lb

## 2016-07-08 DIAGNOSIS — R053 Chronic cough: Secondary | ICD-10-CM

## 2016-07-08 DIAGNOSIS — Z8679 Personal history of other diseases of the circulatory system: Secondary | ICD-10-CM

## 2016-07-08 DIAGNOSIS — R05 Cough: Secondary | ICD-10-CM

## 2016-07-08 MED ORDER — BENZONATATE 100 MG PO CAPS: 100.0000 mg | ORAL_CAPSULE | Freq: Three times a day (TID) | ORAL | 0 refills | Status: DC | PRN

## 2016-07-08 MED ORDER — AZITHROMYCIN 250 MG PO TABS: ORAL_TABLET | ORAL | 0 refills | Status: DC

## 2016-07-08 NOTE — Progress Notes (Signed)
Patient ID: Jon Snyder, male    DOB: 12-Oct-1924  Age: 83 y.o. MRN: [id number]  Chief Complaint  Patient presents with  . Cough    3 weeks     Subjective:   Jon Snyder who comes in here with a 3 week history of a cough. It just started as a cough. He has not had any upper respiratory symptoms. He has not had any fever. May feel a tiny bit run down but is not bad. The cough is more the daytime than at night though occasionally coughs at night. He does not smoke and never has been a smoker. He feels well otherwise. He has a history of hypertension. He has had some springtime allergies in the past but no cough like this that lasted the persistent dry nature of this cough. He is independent, lives alone, golfs every week once or twice. He has a regular doctor.  Current allergies, medications, problem list, past/family and social histories reviewed.  Objective:  BP (!) 112/53 (BP Location: Right Arm, Patient Position: Sitting, Cuff Size: Small)   Pulse 68   Temp Jon.2 F (36.8 C) (Oral)   Resp 16   Ht 5\' 8"  (1.727 m)   Wt 149 lb 9.6 oz (67.9 kg)   SpO2 97%   BMI 22.75 kg/m   No major distress, alert and oriented. Wears hearing aids. Throat clear. Neck supple without significant nodes. Chest is clear without rhonchi, rales, or wheezing. Heart regular without murmur. He has no major history of heart disease that he does have hypertension history.  Assessment & Plan:   Assessment: 1. Persistent dry cough   2. History of essential hypertension       Plan: Nonspecific dry cough. Will treat with a little bit of a shotgun approach, including an antihistamine, azithromycin, and some benzonatate. If symptoms do not improve with these measures he will need a chest x-ray and possibly some lab work. Other consideration would be giving him a round of prednisone at that point but we will hold off on that at this time due to his age.  No orders of the defined types were placed in this  encounter.   Meds ordered this encounter  Medications  . Cholecalciferol (VITAMIN D3) 5000 units CAPS    Sig: Take 5,000 Units by mouth daily.  Marland Kitchen allopurinol (ZYLOPRIM) 100 MG tablet    Sig: Take 100 mg by mouth 2 (two) times daily.         Patient Instructions   Take benzonatate 1 tablet 3 times daily as needed for cough, morning, afternoon, and evening.  Take over-the-counter fexofenadine (Allegra) 1 daily for possible allergic component  Take azithromycin 2 pills initially, then 1 daily for 4 days for possible infection  If not improving over the next 7-10 days please return here or to your primary care doctor for a recheck and would probably need a chest x-ray at that time and possibly some labs.  Return at anytime if worse    IF you received an x-ray today, you will receive an invoice from [facility]. Please contact [facility] at [phone] with questions or concerns regarding your invoice.   IF you received labwork today, you will receive an invoice from [place]. Please contact [facility] at [phone] with questions or concerns regarding your invoice.   Our billing staff will not be able to assist you with questions regarding bills from these companies.  You will be contacted with the lab results as soon as they  are available. The fastest way to get your results is to activate your My Chart account. Instructions are located on the last page of this paperwork. If you have not heard from [place] regarding the results in 2 weeks, please contact this office.         No Follow-up on file.   Shailene Demonbreun, MD 07/08/2016

## 2016-07-08 NOTE — Patient Instructions (Addendum)
Take benzonatate 1 tablet 3 times daily as needed for cough, morning, afternoon, and evening.  Take over-the-counter fexofenadine (Allegra) 1 daily for possible allergic component  Take azithromycin 2 pills initially, then 1 daily for 4 days for possible infection  If not improving over the next 7-10 days please return here or to your primary care doctor for a recheck and would probably need a chest x-ray at that time and possibly some labs.  Return at anytime if worse    IF you received an x-ray today, you will receive an invoice from [facility]. Please contact [facility] at [phone] with questions or concerns regarding your invoice.   IF you received labwork today, you will receive an invoice from [place]. Please contact [facility] at [phone] with questions or concerns regarding your invoice.   Our billing staff will not be able to assist you with questions regarding bills from these companies.  You will be contacted with the lab results as soon as they are available. The fastest way to get your results is to activate your My Chart account. Instructions are located on the last page of this paperwork. If you have not heard from [place] regarding the results in 2 weeks, please contact this office.

## 2016-08-06 DIAGNOSIS — H2511 Age-related nuclear cataract, right eye: Secondary | ICD-10-CM | POA: Diagnosis not present

## 2016-08-06 DIAGNOSIS — H401422 Capsular glaucoma with pseudoexfoliation of lens, left eye, moderate stage: Secondary | ICD-10-CM | POA: Diagnosis not present

## 2016-08-06 DIAGNOSIS — H25011 Cortical age-related cataract, right eye: Secondary | ICD-10-CM | POA: Diagnosis not present

## 2016-08-06 DIAGNOSIS — I10 Essential (primary) hypertension: Secondary | ICD-10-CM | POA: Diagnosis not present

## 2016-08-06 DIAGNOSIS — H25012 Cortical age-related cataract, left eye: Secondary | ICD-10-CM | POA: Diagnosis not present

## 2016-08-06 DIAGNOSIS — H25041 Posterior subcapsular polar age-related cataract, right eye: Secondary | ICD-10-CM | POA: Diagnosis not present

## 2016-08-06 DIAGNOSIS — H2512 Age-related nuclear cataract, left eye: Secondary | ICD-10-CM | POA: Diagnosis not present

## 2016-08-14 DIAGNOSIS — N183 Chronic kidney disease, stage 3 (moderate): Secondary | ICD-10-CM | POA: Diagnosis not present

## 2016-08-19 DIAGNOSIS — M9080 Osteopathy in diseases classified elsewhere, unspecified site: Secondary | ICD-10-CM | POA: Diagnosis not present

## 2016-08-19 DIAGNOSIS — N183 Chronic kidney disease, stage 3 (moderate): Secondary | ICD-10-CM | POA: Diagnosis not present

## 2016-08-19 DIAGNOSIS — E559 Vitamin D deficiency, unspecified: Secondary | ICD-10-CM | POA: Diagnosis not present

## 2016-08-19 DIAGNOSIS — E875 Hyperkalemia: Secondary | ICD-10-CM | POA: Diagnosis not present

## 2016-08-19 DIAGNOSIS — E889 Metabolic disorder, unspecified: Secondary | ICD-10-CM | POA: Diagnosis not present

## 2016-08-19 DIAGNOSIS — M1A30X0 Chronic gout due to renal impairment, unspecified site, without tophus (tophi): Secondary | ICD-10-CM | POA: Diagnosis not present

## 2016-08-19 DIAGNOSIS — I129 Hypertensive chronic kidney disease with stage 1 through stage 4 chronic kidney disease, or unspecified chronic kidney disease: Secondary | ICD-10-CM | POA: Diagnosis not present

## 2016-10-01 ENCOUNTER — Encounter: Payer: Self-pay | Admitting: Family Medicine

## 2016-10-01 ENCOUNTER — Ambulatory Visit (INDEPENDENT_AMBULATORY_CARE_PROVIDER_SITE_OTHER): Payer: Medicare Other | Admitting: Family Medicine

## 2016-10-01 VITALS — BP 141/53 | HR 65 | Temp 97.4°F | Resp 16 | Ht 66.02 in | Wt 150.6 lb

## 2016-10-01 DIAGNOSIS — R197 Diarrhea, unspecified: Secondary | ICD-10-CM | POA: Diagnosis not present

## 2016-10-01 LAB — POCT URINALYSIS DIP (MANUAL ENTRY)
BILIRUBIN UA: NEGATIVE mg/dL
Bilirubin, UA: NEGATIVE
Glucose, UA: NEGATIVE mg/dL
LEUKOCYTES UA: NEGATIVE
Nitrite, UA: NEGATIVE
PH UA: 5 (ref 5.0–8.0)
PROTEIN UA: NEGATIVE mg/dL
RBC UA: NEGATIVE
SPEC GRAV UA: 1.015 (ref 1.010–1.025)
Urobilinogen, UA: 0.2 E.U./dL

## 2016-10-01 LAB — POCT CBC
Granulocyte percent: 64.3 %G (ref 37–80)
HEMATOCRIT: 35.9 % — AB (ref 43.5–53.7)
HEMOGLOBIN: 12.4 g/dL — AB (ref 14.1–18.1)
Lymph, poc: 2.6 (ref 0.6–3.4)
MCH, POC: 32.9 pg — AB (ref 27–31.2)
MCHC: 34.5 g/dL (ref 31.8–35.4)
MCV: 95.3 fL (ref 80–97)
MID (cbc): 0.8 (ref 0–0.9)
MPV: 7.2 fL (ref 0–99.8)
POC GRANULOCYTE: 6.2 (ref 2–6.9)
POC LYMPH PERCENT: 27.2 %L (ref 10–50)
POC MID %: 8.5 % (ref 0–12)
Platelet Count, POC: 247 10*3/uL (ref 142–424)
RBC: 3.76 M/uL — AB (ref 4.69–6.13)
RDW, POC: 14.9 %
WBC: 9.7 10*3/uL (ref 4.6–10.2)

## 2016-10-01 LAB — POC MICROSCOPIC URINALYSIS (UMFC): Mucus: ABSENT

## 2016-10-01 MED ORDER — DICYCLOMINE HCL 10 MG PO CAPS
ORAL_CAPSULE | ORAL | 0 refills | Status: AC
Start: 2016-10-01 — End: ?

## 2016-10-01 NOTE — Patient Instructions (Addendum)
Continue to drink plenty of fluids. While you have the diarrhea avoid dairy products for a few days.  Eating ripened bananas will often help slow the bowels.  Take some over-the-counter Pepto-Bismol for the diarrhea, following the instructions on the container  If you find the Pepto does not help, then take the prescription dicyclomine 10 mg (Bentyl) one twice daily. Sometimes it can cause elderly people to have difficulty urinating, and if you have your urinary difficulty standing a warm shower and try to pee.    Return if problems are persisting.    IF you received an x-ray today, you will receive an invoice from [facility]. Please contact [facility] at [phone] with questions or concerns regarding your invoice.   IF you received labwork today, you will receive an invoice from [place]. Please contact [facility] at [phone] with questions or concerns regarding your invoice.   Our billing staff will not be able to assist you with questions regarding bills from these companies.  You will be contacted with the lab results as soon as they are available. The fastest way to get your results is to activate your My Chart account. Instructions are located on the last page of this paperwork. If you have not heard from [place] regarding the results in 2 weeks, please contact this office.

## 2016-10-01 NOTE — Progress Notes (Signed)
Patient ID: Jon Snyder, male    DOB: Sep 27, 1924  Age: 83 y.o. MRN: [id number]  Chief Complaint  Patient presents with  . Diarrhea    X 5 days    Subjective:   83 year old man who is here with a 5 day history of diarrhea. He is been going for 5 times each morning, then it tapers off and he is okay in the evening. He does not have abdominal pain or cramping. No nausea or vomiting. No fever. No history of doing this in the past. When he has had a little diarrhea in the past that is usually subsided quickly, but this seems to be continuing on.  He does not have any nocturia or urinary symptoms. He is generally a very healthy person. He takes some medicines for his blood pressure. He is single, lives alone. He eats his main meal often the [employer] or somewhere.  Current allergies, medications, problem list, past/family and social histories reviewed.  Objective:  BP (!) 141/53 (BP Location: Right Arm, Patient Position: Sitting, Cuff Size: Normal)   Pulse 65   Temp 97.4 F (36.3 C) (Oral)   Resp 16   Ht 5' 6.02" (1.677 m)   Wt 150 lb 9.6 oz (68.3 kg)   SpO2 95%   BMI 24.29 kg/m   Healthy-appearing, pleasant gentleman. Throat clear. Chest clear drawstrings. Heart regular. Abdomen has normal bowel sounds. Soft, with no organomegaly, masses, or tenderness.  Assessment & Plan:   Assessment: 1. Diarrhea, unspecified type       Plan: We'll do a few basic test because he is elderly for having these symptoms, but will probably just need to treat symptomatically. He has not taken any medications for this.  Orders Placed This Encounter  Procedures  . Comprehensive metabolic panel  . POCT urinalysis dipstick  . POCT Microscopic Urinalysis ([facility])  . POCT CBC    Meds ordered this encounter  Medications  . dicyclomine (BENTYL) 10 MG capsule    Sig: Take one twice daily only if needed for diarrhea.    Dispense:  10 capsule    Refill:  0    Results for orders placed or performed in  visit on 10/01/16  POCT urinalysis dipstick  Result Value Ref Range   Color, UA yellow yellow   Clarity, UA clear clear   Glucose, UA negative negative mg/dL   Bilirubin, UA negative negative   Ketones, POC UA negative negative mg/dL   Spec Grav, UA 1.015 1.010 - 1.025   Blood, UA negative negative   pH, UA 5.0 5.0 - 8.0   Protein Ur, POC negative negative mg/dL   Urobilinogen, UA 0.2 0.2 or 1.0 E.U./dL   Nitrite, UA Negative Negative   Leukocytes, UA Negative Negative  POCT Microscopic Urinalysis ([facility])  Result Value Ref Range   WBC,UR,HPF,POC None None WBC/hpf   RBC,UR,HPF,POC None None RBC/hpf   Bacteria None None, Too numerous to count   Mucus Absent Absent   Epithelial Cells, UR Per Microscopy Few (A) None, Too numerous to count cells/hpf  POCT CBC  Result Value Ref Range   WBC 9.7 4.6 - 10.2 K/uL   Lymph, poc 2.6 0.6 - 3.4   POC LYMPH PERCENT 27.2 10 - 50 %L   MID (cbc) 0.8 0 - 0.9   POC MID % 8.5 0 - 12 %M   POC Granulocyte 6.2 2 - 6.9   Granulocyte percent 64.3 37 - 80 %G   RBC 3.76 (A) 4.69 -  6.13 M/uL   Hemoglobin 12.4 (A) 14.1 - 18.1 g/dL   HCT, POC 35.9 (A) 43.5 - 53.7 %   MCV 95.3 80 - 97 fL   MCH, POC 32.9 (A) 27 - 31.2 pg   MCHC 34.5 31.8 - 35.4 g/dL   RDW, POC 14.9 %   Platelet Count, POC 247 142 - 424 K/uL   MPV 7.2 0 - 99.8 fL        Patient Instructions   Continue to drink plenty of fluids. While you have the diarrhea avoid dairy products for a few days.  Eating ripened bananas will often help slow the bowels.  Take some over-the-counter Pepto-Bismol for the diarrhea, following the instructions on the container  If you find the Pepto does not help, then take the prescription dicyclomine 10 mg (Bentyl) one twice daily. Sometimes it can cause elderly people to have difficulty urinating, and if you have your urinary difficulty standing a warm shower and try to pee.    Return if problems are persisting.    IF you received an x-ray today, you  will receive an invoice from [facility]. Please contact [facility] at [phone] with questions or concerns regarding your invoice.   IF you received labwork today, you will receive an invoice from [place]. Please contact [facility] at [phone] with questions or concerns regarding your invoice.   Our billing staff will not be able to assist you with questions regarding bills from these companies.  You will be contacted with the lab results as soon as they are available. The fastest way to get your results is to activate your My Chart account. Instructions are located on the last page of this paperwork. If you have not heard from [place] regarding the results in 2 weeks, please contact this office.         No Follow-up on file.   HOPPER,DAVID, MD 10/01/2016

## 2016-10-02 LAB — COMPREHENSIVE METABOLIC PANEL
ALBUMIN: 4.7 g/dL (ref 3.5–4.7)
ALK PHOS: 60 IU/L (ref 39–117)
ALT: 19 IU/L (ref 0–44)
AST: 28 IU/L (ref 0–40)
Albumin/Globulin Ratio: 1.8 (ref 1.2–2.2)
BILIRUBIN TOTAL: 0.5 mg/dL (ref 0.0–1.2)
BUN / CREAT RATIO: 19 (ref 10–24)
BUN: 50 mg/dL — ABNORMAL HIGH (ref 8–27)
CHLORIDE: 108 mmol/L — AB (ref 96–106)
CO2: 18 mmol/L — ABNORMAL LOW (ref 20–29)
Calcium: 9.7 mg/dL (ref 8.6–10.2)
Creatinine, Ser: 2.58 mg/dL — ABNORMAL HIGH (ref 0.76–1.27)
GFR calc Af Amer: 24 mL/min/{1.73_m2} — ABNORMAL LOW (ref >59)
GFR calc non Af Amer: 21 mL/min/{1.73_m2} — ABNORMAL LOW (ref >59)
Globulin, Total: 2.6 g/dL (ref 1.5–4.5)
Glucose: 116 mg/dL — ABNORMAL HIGH (ref 65–99)
POTASSIUM: 5 mmol/L (ref 3.5–5.2)
Sodium: 143 mmol/L (ref 134–144)
Total Protein: 7.3 g/dL (ref 6.0–8.5)

## 2016-12-09 DIAGNOSIS — H25011 Cortical age-related cataract, right eye: Secondary | ICD-10-CM | POA: Diagnosis not present

## 2016-12-09 DIAGNOSIS — L719 Rosacea, unspecified: Secondary | ICD-10-CM | POA: Diagnosis not present

## 2016-12-09 DIAGNOSIS — H2511 Age-related nuclear cataract, right eye: Secondary | ICD-10-CM | POA: Diagnosis not present

## 2016-12-09 DIAGNOSIS — H401413 Capsular glaucoma with pseudoexfoliation of lens, right eye, severe stage: Secondary | ICD-10-CM | POA: Diagnosis not present

## 2016-12-09 DIAGNOSIS — H401422 Capsular glaucoma with pseudoexfoliation of lens, left eye, moderate stage: Secondary | ICD-10-CM | POA: Diagnosis not present

## 2017-01-06 ENCOUNTER — Ambulatory Visit (INDEPENDENT_AMBULATORY_CARE_PROVIDER_SITE_OTHER): Payer: Medicare Other | Admitting: Emergency Medicine

## 2017-01-06 ENCOUNTER — Encounter: Payer: Self-pay | Admitting: Emergency Medicine

## 2017-01-06 VITALS — BP 130/56 | HR 70 | Temp 97.4°F | Resp 16 | Ht 65.0 in | Wt 147.2 lb

## 2017-01-06 DIAGNOSIS — R197 Diarrhea, unspecified: Secondary | ICD-10-CM | POA: Insufficient documentation

## 2017-01-06 LAB — POCT CBC
Granulocyte percent: 60.3 %G (ref 37–80)
HCT, POC: 36.9 % — AB (ref 43.5–53.7)
Hemoglobin: 12.8 g/dL — AB (ref 14.1–18.1)
Lymph, poc: 2.1 (ref 0.6–3.4)
MCH, POC: 33.7 pg — AB (ref 27–31.2)
MCHC: 34.6 g/dL (ref 31.8–35.4)
MCV: 97.3 fL — AB (ref 80–97)
MID (cbc): 1.6 — AB (ref 0–0.9)
MPV: 7.3 fL (ref 0–99.8)
PLATELET COUNT, POC: 205 10*3/uL (ref 142–424)
POC Granulocyte: 5.5 (ref 2–6.9)
POC LYMPH %: 22.7 % (ref 10–50)
POC MID %: 17 %M — AB (ref 0–12)
RBC: 3.8 M/uL — AB (ref 4.69–6.13)
RDW, POC: 14.4 %
WBC: 9.2 10*3/uL (ref 4.6–10.2)

## 2017-01-06 MED ORDER — LOPERAMIDE HCL 2 MG PO CAPS: 2.0000 mg | ORAL_CAPSULE | ORAL | 0 refills | Status: AC | PRN

## 2017-01-06 NOTE — Progress Notes (Signed)
Jon Snyder 83 y.o.   Chief Complaint  Patient presents with  . Diarrhea    x 2 weeks per patient goes and come    HISTORY OF PRESENT ILLNESS: This is a 83 y.o. male complaining of diarrhea x 2 weeks.Chronic problem.  Diarrhea   This is a chronic problem. The current episode started more than 1 year ago. The problem has been waxing and waning. The stool consistency is described as watery. The patient states that diarrhea does not awaken him from sleep. Pertinent negatives include no abdominal pain, chills, fever, headaches, myalgias or vomiting. There are no known risk factors. He has tried nothing for the symptoms.     Prior to Admission medications   Medication Sig Start Date End Date Taking? Authorizing Provider  allopurinol (ZYLOPRIM) 100 MG tablet Take 100 mg by mouth 2 (two) times daily.   Yes [provider]  amLODipine (NORVASC) 10 MG tablet Take 10 mg by mouth daily.   Yes [provider]  Cholecalciferol (VITAMIN D3) 5000 units CAPS Take 5,000 Units by mouth daily.   Yes [provider]  furosemide (LASIX) 20 MG tablet Take 20 mg by mouth.   Yes [provider]  hydrALAZINE (APRESOLINE) 25 MG tablet Take 25 mg by mouth daily.    Yes [provider]  metoprolol tartrate (LOPRESSOR) 25 MG tablet Take 25 mg by mouth 2 (two) times daily.   Yes [provider]  olmesartan (BENICAR) 40 MG tablet Take 40 mg by mouth daily.   Yes [provider]  dicyclomine (BENTYL) 10 MG capsule Take one twice daily only if needed for diarrhea. Patient not taking: Reported on 01/06/2017 10/01/16   Posey Boyer, MD    No Known Allergies  Patient Active Problem List   Diagnosis Date Noted  . History of essential hypertension 07/08/2016    No past medical history on file.  Past Surgical History:  Procedure Laterality Date  . EYE SURGERY Left    2018    Social History   Social History  . Marital status: Widowed    Spouse  name: N/A  . Number of children: N/A  . Years of education: N/A   Occupational History  . Not on file.   Social History Main Topics  . Smoking status: Never Smoker  . Smokeless tobacco: Never Used  . Alcohol use No  . Drug use: No  . Sexual activity: Not on file   Other Topics Concern  . Not on file   Social History Narrative  . No narrative on file    No family history on file.   Review of Systems  Constitutional: Negative for chills and fever.  Respiratory: Negative for shortness of breath.   Cardiovascular: Negative for chest pain.  Gastrointestinal: Positive for diarrhea. Negative for abdominal pain, blood in stool, melena and vomiting.  Genitourinary: Negative for dysuria and hematuria.  Musculoskeletal: Negative for myalgias.  Skin: Negative for rash.  Neurological: Negative for dizziness and headaches.  All other systems reviewed and are negative.    Vitals:   01/06/17 1602  BP: (!) 130/56  Pulse: 70  Resp: 16  Temp: (!) 97.4 F (36.3 C)  SpO2: 96%     Physical Exam  Constitutional: He is oriented to person, place, and time. He appears well-developed and well-nourished.  HENT:  Head: Normocephalic and atraumatic.  Mouth/Throat: Oropharynx is clear and moist.  Eyes: Pupils are equal, round, and reactive to light. Conjunctivae and EOM are  normal.  Neck: Normal range of motion.  Cardiovascular: Normal rate, regular rhythm and normal heart sounds.   Pulmonary/Chest: Effort normal and breath sounds normal.  Abdominal: Soft. Bowel sounds are normal. He exhibits no distension. There is no tenderness.  Musculoskeletal: Normal range of motion.  Neurological: He is alert and oriented to person, place, and time. No sensory deficit. He exhibits normal muscle tone.  Skin: Skin is warm and dry. Capillary refill takes less than 2 seconds. No rash noted.  Psychiatric: He has a normal mood and affect. His behavior is normal.  Vitals reviewed.    Results for  orders placed or performed in visit on 01/06/17 (from the past 72 hour(s))  POCT CBC     Status: Abnormal   Collection Time: 01/06/17  4:51 PM  Result Value Ref Range   WBC 9.2 4.6 - 10.2 K/uL   Lymph, poc 2.1 0.6 - 3.4   POC LYMPH PERCENT 22.7 10 - 50 %L   MID (cbc) 1.6 (A) 0 - 0.9   POC MID % 17.0 (A) 0 - 12 %M   POC Granulocyte 5.5 2 - 6.9   Granulocyte percent 60.3 37 - 80 %G   RBC 3.80 (A) 4.69 - 6.13 M/uL   Hemoglobin 12.8 (A) 14.1 - 18.1 g/dL   HCT, POC 36.9 (A) 43.5 - 53.7 %   MCV 97.3 (A) 80 - 97 fL   MCH, POC 33.7 (A) 27 - 31.2 pg   MCHC 34.6 31.8 - 35.4 g/dL   RDW, POC 14.4 %   Platelet Count, POC 205 142 - 424 K/uL   MPV 7.3 0 - 99.8 fL  Comprehensive metabolic panel     Status: Abnormal   Collection Time: 01/06/17  4:55 PM  Result Value Ref Range   Glucose 97 65 - 99 mg/dL   BUN 35 10 - 36 mg/dL   Creatinine, Ser 2.31 (H) 0.76 - 1.27 mg/dL   GFR calc non Af Amer 24 (L) >59 mL/min/1.73   GFR calc Af Amer 28 (L) >59 mL/min/1.73   BUN/Creatinine Ratio 15 10 - 24   Sodium 139 134 - 144 mmol/L   Potassium 4.3 3.5 - 5.2 mmol/L   Chloride 104 96 - 106 mmol/L   CO2 21 20 - 29 mmol/L   Calcium 9.1 8.6 - 10.2 mg/dL   Total Protein 6.9 6.0 - 8.5 g/dL   Albumin 4.5 3.2 - 4.6 g/dL   Globulin, Total 2.4 1.5 - 4.5 g/dL   Albumin/Globulin Ratio 1.9 1.2 - 2.2   Bilirubin Total 0.5 0.0 - 1.2 mg/dL   Alkaline Phosphatase 57 39 - 117 IU/L   AST 21 0 - 40 IU/L   ALT 13 0 - 44 IU/L     ASSESSMENT & PLAN: We'll do a few basic test because he is elderly for having these symptoms, but will probably just need to treat symptomatically. He has not taken any medications for this. Jon Snyder was seen today for diarrhea.  Diagnoses and all orders for this visit:  Diarrhea, unspecified type -     POCT CBC -     Comprehensive metabolic panel -     Ambulatory referral to Gastroenterology -     loperamide (IMODIUM) 2 MG capsule; Take 1 capsule (2 mg total) by mouth as needed for diarrhea  or loose stools.    Patient Instructions       IF you received an x-ray today, you will receive an invoice from [facility]. Please  contact [facility] at [phone] with questions or concerns regarding your invoice.   IF you received labwork today, you will receive an invoice from [place]. Please contact [facility] at [phone] with questions or concerns regarding your invoice.   Our billing staff will not be able to assist you with questions regarding bills from these companies.  You will be contacted with the lab results as soon as they are available. The fastest way to get your results is to activate your My Chart account. Instructions are located on the last page of this paperwork. If you have not heard from [place] regarding the results in 2 weeks, please contact this office.     Chronic Diarrhea Diarrhea is a condition in which a person passes frequent loose and watery stools. It can cause you to feel weak and dehydrated. Dehydration can make you tired and thirsty. It can also cause a dry mouth, decreased urination, and dark yellow urine. Diarrhea is a sign of another underlying problem, such as:  Infection.  Medication side effects.  Dietary intolerance, such as lactose intolerance.  Conditions such as celiac disease, irritable bowel syndrome (IBS), or inflammatory bowel disease (IBD).  In most cases, diarrhea lasts 2-3 days. Diarrhea that lasts longer than 4 weeks is called long-lasting (chronic) diarrhea. It is important that you treat your diarrhea as told by your health care provider. Follow these instructions at home: Follow these recommendations as told by your health care provider. Eating and drinking  Take an oral rehydration solution (ORS). This is a drink that is designed to keep you hydrated. It can be found at pharmacies and retail stores.  Drink clear fluids, such as water, ice chips, diluted fruit juice, and low-calorie sports  drinks.  Follow the diet recommended by your health care provider. You may need to avoid foods that trigger diarrhea for you.  Avoid foods and beverages that contain a lot of sugar or caffeine.  Avoid alcohol.  Avoid spicy or fatty foods. General instructions  Drink enough fluid to keep your urine clear or pale yellow.  Wash your hands often and after each diarrhea episode. If soap and water are not available, use hand sanitizer.  Make sure that all people in your household wash their hands well and often.  Take over-the-counter and prescription medicines only as told by your health care provider.  If you were prescribed an antibiotic medicine, take it as told by your health care provider. Do not stop taking the antibiotic even if you start to feel better.  Rest at home while you recover.  Watch your condition for any changes.  Take a warm bath to relieve any burning or pain from frequent diarrhea episodes.  Keep all follow-up visits as told by your health care provider. This is important. Contact a health care provider if:  You have a fever.  Your diarrhea gets worse or does not get better.  You have new symptoms.  You cannot drink fluids without vomiting.  You feel light-headed or dizzy.  You have a headache.  You have muscle cramps.  You have severe pain in the rectum. Get help right away if:  You have persistent vomiting.  You have chest pain.  You feel extremely weak or you faint.  You have bloody or black stools, or stools that look like tar.  You have severe pain, cramping, or bloating in your abdomen, or pain that stays in one place.  You have trouble breathing or you are breathing very quickly.  Your heart is beating very quickly.  Your skin feels cold and clammy.  You feel confused.  You have a severe headache.  You have signs of dehydration, such as: ? Dark urine, very little urine, or no urine. ? Cracked lips. ? Dry mouth. ? Sunken  eyes. ? Sleepiness. ? Weakness. Summary  Chronic diarrhea is a condition in which a person passes frequent loose and watery stools for more than 4 weeks.  Diarrhea is a sign of another underlying problem.  Drink enough fluid to keep your urine clear or pale yellow to avoid dehydration.  Wash your hands often and after each diarrhea episode. If soap and water are not available, use hand sanitizer.  It is important that you treat your diarrhea as told by your health care provider. This information is not intended to replace advice given to you by your health care provider. Make sure you discuss any questions you have with your health care provider. Document Released: 06/13/2003 Document Revised: 02/10/2016 Document Reviewed: 02/10/2016 Elsevier Interactive Patient Education  2017 [employer]     Agustina Caroli, MD [facility]

## 2017-01-06 NOTE — Patient Instructions (Addendum)
   IF you received an x-ray today, you will receive an invoice from [facility]. Please contact [facility] at [phone] with questions or concerns regarding your invoice.   IF you received labwork today, you will receive an invoice from [facility]. Please contact [facility] at [phone] with questions or concerns regarding your invoice.   Our billing staff will not be able to assist you with questions regarding bills from these companies.  You will be contacted with the lab results as soon as they are available. The fastest way to get your results is to activate your My Chart account. Instructions are located on the last page of this paperwork. If you have not heard from us regarding the results in 2 weeks, please contact this office.     Chronic Diarrhea Diarrhea is a condition in which a person passes frequent loose and watery stools. It can cause you to feel weak and dehydrated. Dehydration can make you tired and thirsty. It can also cause a dry mouth, decreased urination, and dark yellow urine. Diarrhea is a sign of another underlying problem, such as:  Infection.  Medication side effects.  Dietary intolerance, such as lactose intolerance.  Conditions such as celiac disease, irritable bowel syndrome (IBS), or inflammatory bowel disease (IBD).  In most cases, diarrhea lasts 2-3 days. Diarrhea that lasts longer than 4 weeks is called long-lasting (chronic) diarrhea. It is important that you treat your diarrhea as told by your health care provider. Follow these instructions at home: Follow these recommendations as told by your health care provider. Eating and drinking  Take an oral rehydration solution (ORS). This is a drink that is designed to keep you hydrated. It can be found at pharmacies and retail stores.  Drink clear fluids, such as water, ice chips, diluted fruit juice, and low-calorie sports drinks.  Follow the diet recommended by your health care  provider. You may need to avoid foods that trigger diarrhea for you.  Avoid foods and beverages that contain a lot of sugar or caffeine.  Avoid alcohol.  Avoid spicy or fatty foods. General instructions  Drink enough fluid to keep your urine clear or pale yellow.  Wash your hands often and after each diarrhea episode. If soap and water are not available, use hand sanitizer.  Make sure that all people in your household wash their hands well and often.  Take over-the-counter and prescription medicines only as told by your health care provider.  If you were prescribed an antibiotic medicine, take it as told by your health care provider. Do not stop taking the antibiotic even if you start to feel better.  Rest at home while you recover.  Watch your condition for any changes.  Take a warm bath to relieve any burning or pain from frequent diarrhea episodes.  Keep all follow-up visits as told by your health care provider. This is important. Contact a health care provider if:  You have a fever.  Your diarrhea gets worse or does not get better.  You have new symptoms.  You cannot drink fluids without vomiting.  You feel light-headed or dizzy.  You have a headache.  You have muscle cramps.  You have severe pain in the rectum. Get help right away if:  You have persistent vomiting.  You have chest pain.  You feel extremely weak or you faint.  You have bloody or black stools, or stools that look like tar.  You have severe pain, cramping, or bloating in your abdomen, or   pain that stays in one place.  You have trouble breathing or you are breathing very quickly.  Your heart is beating very quickly.  Your skin feels cold and clammy.  You feel confused.  You have a severe headache.  You have signs of dehydration, such as: ? Dark urine, very little urine, or no urine. ? Cracked lips. ? Dry mouth. ? Sunken eyes. ? Sleepiness. ? Weakness. Summary  Chronic  diarrhea is a condition in which a person passes frequent loose and watery stools for more than 4 weeks.  Diarrhea is a sign of another underlying problem.  Drink enough fluid to keep your urine clear or pale yellow to avoid dehydration.  Wash your hands often and after each diarrhea episode. If soap and water are not available, use hand sanitizer.  It is important that you treat your diarrhea as told by your health care provider. This information is not intended to replace advice given to you by your health care provider. Make sure you discuss any questions you have with your health care provider. Document Released: 06/13/2003 Document Revised: 02/10/2016 Document Reviewed: 02/10/2016 Elsevier Interactive Patient Education  2017 Elsevier Inc.  

## 2017-01-07 LAB — COMPREHENSIVE METABOLIC PANEL
ALBUMIN: 4.5 g/dL (ref 3.2–4.6)
ALT: 13 IU/L (ref 0–44)
AST: 21 IU/L (ref 0–40)
Albumin/Globulin Ratio: 1.9 (ref 1.2–2.2)
Alkaline Phosphatase: 57 IU/L (ref 39–117)
BILIRUBIN TOTAL: 0.5 mg/dL (ref 0.0–1.2)
BUN / CREAT RATIO: 15 (ref 10–24)
BUN: 35 mg/dL (ref 10–36)
CALCIUM: 9.1 mg/dL (ref 8.6–10.2)
CO2: 21 mmol/L (ref 20–29)
Chloride: 104 mmol/L (ref 96–106)
Creatinine, Ser: 2.31 mg/dL — ABNORMAL HIGH (ref 0.76–1.27)
GFR calc non Af Amer: 24 mL/min/{1.73_m2} — ABNORMAL LOW (ref >59)
GFR, EST AFRICAN AMERICAN: 28 mL/min/{1.73_m2} — AB (ref >59)
Globulin, Total: 2.4 g/dL (ref 1.5–4.5)
Glucose: 97 mg/dL (ref 65–99)
POTASSIUM: 4.3 mmol/L (ref 3.5–5.2)
Sodium: 139 mmol/L (ref 134–144)
Total Protein: 6.9 g/dL (ref 6.0–8.5)

## 2017-02-11 DIAGNOSIS — M1A30X0 Chronic gout due to renal impairment, unspecified site, without tophus (tophi): Secondary | ICD-10-CM | POA: Diagnosis not present

## 2017-02-11 DIAGNOSIS — N183 Chronic kidney disease, stage 3 (moderate): Secondary | ICD-10-CM | POA: Diagnosis not present

## 2017-02-15 DIAGNOSIS — E559 Vitamin D deficiency, unspecified: Secondary | ICD-10-CM | POA: Diagnosis not present

## 2017-02-15 DIAGNOSIS — M9080 Osteopathy in diseases classified elsewhere, unspecified site: Secondary | ICD-10-CM | POA: Diagnosis not present

## 2017-02-15 DIAGNOSIS — I129 Hypertensive chronic kidney disease with stage 1 through stage 4 chronic kidney disease, or unspecified chronic kidney disease: Secondary | ICD-10-CM | POA: Diagnosis not present

## 2017-02-15 DIAGNOSIS — N184 Chronic kidney disease, stage 4 (severe): Secondary | ICD-10-CM | POA: Diagnosis not present

## 2017-02-15 DIAGNOSIS — E875 Hyperkalemia: Secondary | ICD-10-CM | POA: Diagnosis not present

## 2017-02-15 DIAGNOSIS — E889 Metabolic disorder, unspecified: Secondary | ICD-10-CM | POA: Diagnosis not present

## 2017-02-16 ENCOUNTER — Encounter: Payer: Self-pay | Admitting: Emergency Medicine

## 2017-06-07 DIAGNOSIS — H401422 Capsular glaucoma with pseudoexfoliation of lens, left eye, moderate stage: Secondary | ICD-10-CM | POA: Diagnosis not present

## 2017-06-07 DIAGNOSIS — H25011 Cortical age-related cataract, right eye: Secondary | ICD-10-CM | POA: Diagnosis not present

## 2017-06-07 DIAGNOSIS — H1045 Other chronic allergic conjunctivitis: Secondary | ICD-10-CM | POA: Diagnosis not present

## 2017-06-07 DIAGNOSIS — H2511 Age-related nuclear cataract, right eye: Secondary | ICD-10-CM | POA: Diagnosis not present

## 2017-06-07 DIAGNOSIS — H401413 Capsular glaucoma with pseudoexfoliation of lens, right eye, severe stage: Secondary | ICD-10-CM | POA: Diagnosis not present

## 2017-08-16 DIAGNOSIS — E889 Metabolic disorder, unspecified: Secondary | ICD-10-CM | POA: Diagnosis not present

## 2017-08-16 DIAGNOSIS — E559 Vitamin D deficiency, unspecified: Secondary | ICD-10-CM | POA: Diagnosis not present

## 2017-08-16 DIAGNOSIS — N184 Chronic kidney disease, stage 4 (severe): Secondary | ICD-10-CM | POA: Diagnosis not present

## 2017-08-16 DIAGNOSIS — I129 Hypertensive chronic kidney disease with stage 1 through stage 4 chronic kidney disease, or unspecified chronic kidney disease: Secondary | ICD-10-CM | POA: Diagnosis not present

## 2017-08-16 DIAGNOSIS — M9080 Osteopathy in diseases classified elsewhere, unspecified site: Secondary | ICD-10-CM | POA: Diagnosis not present

## 2017-08-19 DIAGNOSIS — E559 Vitamin D deficiency, unspecified: Secondary | ICD-10-CM | POA: Diagnosis not present

## 2017-08-19 DIAGNOSIS — I129 Hypertensive chronic kidney disease with stage 1 through stage 4 chronic kidney disease, or unspecified chronic kidney disease: Secondary | ICD-10-CM | POA: Diagnosis not present

## 2017-08-19 DIAGNOSIS — M10379 Gout due to renal impairment, unspecified ankle and foot: Secondary | ICD-10-CM | POA: Diagnosis not present

## 2017-08-19 DIAGNOSIS — N184 Chronic kidney disease, stage 4 (severe): Secondary | ICD-10-CM | POA: Diagnosis not present

## 2017-08-19 DIAGNOSIS — E875 Hyperkalemia: Secondary | ICD-10-CM | POA: Diagnosis not present

## 2017-08-19 DIAGNOSIS — M9080 Osteopathy in diseases classified elsewhere, unspecified site: Secondary | ICD-10-CM | POA: Diagnosis not present

## 2017-08-19 DIAGNOSIS — E889 Metabolic disorder, unspecified: Secondary | ICD-10-CM | POA: Diagnosis not present

## 2017-12-20 DIAGNOSIS — H2511 Age-related nuclear cataract, right eye: Secondary | ICD-10-CM | POA: Diagnosis not present

## 2017-12-20 DIAGNOSIS — H401413 Capsular glaucoma with pseudoexfoliation of lens, right eye, severe stage: Secondary | ICD-10-CM | POA: Diagnosis not present

## 2017-12-20 DIAGNOSIS — H401422 Capsular glaucoma with pseudoexfoliation of lens, left eye, moderate stage: Secondary | ICD-10-CM | POA: Diagnosis not present

## 2017-12-20 DIAGNOSIS — H25011 Cortical age-related cataract, right eye: Secondary | ICD-10-CM | POA: Diagnosis not present

## 2018-01-05 DIAGNOSIS — H903 Sensorineural hearing loss, bilateral: Secondary | ICD-10-CM | POA: Diagnosis not present

## 2018-02-21 DIAGNOSIS — E559 Vitamin D deficiency, unspecified: Secondary | ICD-10-CM | POA: Diagnosis not present

## 2018-02-21 DIAGNOSIS — I129 Hypertensive chronic kidney disease with stage 1 through stage 4 chronic kidney disease, or unspecified chronic kidney disease: Secondary | ICD-10-CM | POA: Diagnosis not present

## 2018-02-21 DIAGNOSIS — N184 Chronic kidney disease, stage 4 (severe): Secondary | ICD-10-CM | POA: Diagnosis not present

## 2018-02-24 DIAGNOSIS — D631 Anemia in chronic kidney disease: Secondary | ICD-10-CM | POA: Diagnosis not present

## 2018-02-24 DIAGNOSIS — E559 Vitamin D deficiency, unspecified: Secondary | ICD-10-CM | POA: Diagnosis not present

## 2018-02-24 DIAGNOSIS — M9080 Osteopathy in diseases classified elsewhere, unspecified site: Secondary | ICD-10-CM | POA: Diagnosis not present

## 2018-02-24 DIAGNOSIS — E875 Hyperkalemia: Secondary | ICD-10-CM | POA: Diagnosis not present

## 2018-02-24 DIAGNOSIS — N184 Chronic kidney disease, stage 4 (severe): Secondary | ICD-10-CM | POA: Diagnosis not present

## 2018-02-24 DIAGNOSIS — E889 Metabolic disorder, unspecified: Secondary | ICD-10-CM | POA: Diagnosis not present

## 2018-02-24 DIAGNOSIS — I129 Hypertensive chronic kidney disease with stage 1 through stage 4 chronic kidney disease, or unspecified chronic kidney disease: Secondary | ICD-10-CM | POA: Diagnosis not present

## 2018-03-10 DIAGNOSIS — E875 Hyperkalemia: Secondary | ICD-10-CM | POA: Diagnosis not present

## 2019-06-02 ENCOUNTER — Ambulatory Visit: Payer: Medicare Other | Attending: Internal Medicine

## 2019-06-02 DIAGNOSIS — Z23 Encounter for immunization: Secondary | ICD-10-CM

## 2019-06-02 NOTE — Progress Notes (Signed)
   Covid-19 Vaccination Clinic  Name:  Jon Snyder    MRN: [id number] DOB: 1925-03-01  06/02/2019  Jon Snyder was observed post Covid-19 immunization for 15 minutes without incidence. He was provided with Vaccine Information Sheet and instruction to access the V-Safe system.   Jon Snyder was instructed to call 911 with any severe reactions post vaccine: Marland Kitchen Difficulty breathing  . Swelling of your face and throat  . A fast heartbeat  . A bad rash all over your body  . Dizziness and weakness    Immunizations Administered    Name Date Dose VIS Date Route   Pfizer COVID-19 Vaccine 06/02/2019  8:28 AM 0.3 mL 03/17/2019 Intramuscular   Manufacturer: [place]   Lot: [id number]   [place]: [id number]

## 2019-06-27 ENCOUNTER — Ambulatory Visit: Payer: Medicare Other | Attending: Internal Medicine

## 2019-06-27 DIAGNOSIS — Z23 Encounter for immunization: Secondary | ICD-10-CM

## 2019-06-27 NOTE — Progress Notes (Signed)
   Covid-19 Vaccination Clinic  Name:  [name]    MRN: [id number] DOB: Jun 26, 1924  06/27/2019  [name] was observed post Covid-19 immunization for 15 minutes without incident. He was provided with Vaccine Information Sheet and instruction to access the V-Safe system.   [name] was instructed to call 911 with any severe reactions post vaccine: Marland Kitchen Difficulty breathing  . Swelling of face and throat  . A fast heartbeat  . A bad rash all over body  . Dizziness and weakness   Immunizations Administered    Name Date Dose VIS Date Route   Pfizer COVID-19 Vaccine 06/27/2019 10:54 AM 0.3 mL 03/17/2019 Intramuscular   Manufacturer: [place]   Lot: [id number]   [place]: [id number]

## 2020-04-17 ENCOUNTER — Other Ambulatory Visit: Payer: Self-pay

## 2020-04-17 ENCOUNTER — Encounter: Payer: Self-pay | Admitting: Dermatology

## 2020-04-17 ENCOUNTER — Ambulatory Visit (INDEPENDENT_AMBULATORY_CARE_PROVIDER_SITE_OTHER): Payer: Medicare Other | Admitting: Dermatology

## 2020-04-17 DIAGNOSIS — Z1283 Encounter for screening for malignant neoplasm of skin: Secondary | ICD-10-CM

## 2020-04-17 DIAGNOSIS — Z85828 Personal history of other malignant neoplasm of skin: Secondary | ICD-10-CM

## 2020-04-17 DIAGNOSIS — D485 Neoplasm of uncertain behavior of skin: Secondary | ICD-10-CM

## 2020-04-17 DIAGNOSIS — L570 Actinic keratosis: Secondary | ICD-10-CM

## 2020-04-17 DIAGNOSIS — C44511 Basal cell carcinoma of skin of breast: Secondary | ICD-10-CM

## 2020-04-17 NOTE — Patient Instructions (Signed)

## 2020-04-22 ENCOUNTER — Encounter: Payer: Self-pay | Admitting: Dermatology

## 2020-04-22 NOTE — Progress Notes (Signed)
   Follow-Up Visit   Subjective  Jon Snyder is a 87 y.o. male who presents for the following: Annual Exam (Chest- sore x 3 months/EXTREMELY HARD OF HEARING).  Growth Location: Left chest Duration: Several months Quality: Painful and bleeding Associated Signs/Symptoms: Modifying Factors:  Severity:  Timing: Context: Also wants crusts on face looked at.  Objective  Well appearing patient in no apparent distress; mood and affect are within normal limits. Objective  Dorsum of Nose: No sign recurrence  Objective  Left Parotid Area: No sign recurrence  Objective  Left Breast: 2.4cm centrally eroded hemorrhagic nodule with rolled pearly edges     Objective  Left Buccal Cheek, Right Forehead: Half dozen 3 to 10 mm flat pink crusts which are likely a mixture of hypertrophic AK's plus superficial carcinomas.    All skin waist up examined.   Assessment & Plan    History of basal cell cancer Dorsum of Nose  Yearly skin check  History of squamous cell carcinoma of skin Left Parotid Area  Check as needed change  Neoplasm of uncertain behavior of skin Left Breast  Skin / nail biopsy Type of biopsy: tangential   Informed consent: discussed and consent obtained   Timeout: patient name, date of birth, surgical site, and procedure verified   Procedure prep:  Patient was prepped and draped in usual sterile fashion (Non sterile) Prep type:  Chlorhexidine Anesthesia: the lesion was anesthetized in a standard fashion   Anesthetic:  1% lidocaine w/ epinephrine 1-100,000 local infiltration Instrument used: flexible razor blade   Outcome: patient tolerated procedure well   Post-procedure details: wound care instructions given    Specimen 1 - Surgical pathology Differential Diagnosis: bcc vs scc Check Margins: No  AK (actinic keratosis) (2) Left Buccal Cheek; Right Forehead  None of these currently bothering this 87 year old gentleman so no intervention at this  time.      I, Lavonna Monarch, MD, have reviewed all documentation for this visit.  The documentation on 04/22/20 for the exam, diagnosis, procedures, and orders are all accurate and complete.

## 2020-04-24 ENCOUNTER — Ambulatory Visit: Payer: Medicare Other | Admitting: Dermatology

## 2020-04-24 ENCOUNTER — Telehealth: Payer: Self-pay

## 2020-04-24 NOTE — Telephone Encounter (Signed)
Phone call to patient with his pathology results and to explain the [facility] procedure to the patient.  Patient aware.

## 2020-04-24 NOTE — Telephone Encounter (Signed)
-----   Message from Lavonna Monarch, MD sent at 04/24/2020  4:24 AM EST ----- Schedule Mohs

## 2020-08-14 ENCOUNTER — Other Ambulatory Visit: Payer: Self-pay

## 2020-08-14 ENCOUNTER — Emergency Department (HOSPITAL_COMMUNITY)
Admission: EM | Admit: 2020-08-14 | Discharge: 2020-08-16 | Disposition: A | Discharge status: Home / Self Care | Payer: Medicare Other | Attending: Emergency Medicine | Admitting: Emergency Medicine

## 2020-08-14 ENCOUNTER — Emergency Department (HOSPITAL_COMMUNITY): Payer: Medicare Other

## 2020-08-14 ENCOUNTER — Encounter (HOSPITAL_COMMUNITY): Payer: Self-pay

## 2020-08-14 DIAGNOSIS — R410 Disorientation, unspecified: Secondary | ICD-10-CM | POA: Diagnosis not present

## 2020-08-14 DIAGNOSIS — Z20822 Contact with and (suspected) exposure to covid-19: Secondary | ICD-10-CM | POA: Diagnosis not present

## 2020-08-14 DIAGNOSIS — N185 Chronic kidney disease, stage 5: Secondary | ICD-10-CM | POA: Insufficient documentation

## 2020-08-14 DIAGNOSIS — H9193 Unspecified hearing loss, bilateral: Secondary | ICD-10-CM | POA: Insufficient documentation

## 2020-08-14 DIAGNOSIS — N189 Chronic kidney disease, unspecified: Secondary | ICD-10-CM

## 2020-08-14 DIAGNOSIS — F0390 Unspecified dementia without behavioral disturbance: Secondary | ICD-10-CM | POA: Diagnosis not present

## 2020-08-14 HISTORY — DX: Unspecified hearing loss, unspecified ear: H91.90

## 2020-08-14 HISTORY — DX: Chronic kidney disease, stage 5: N18.5

## 2020-08-14 HISTORY — DX: Unspecified dementia, unspecified severity, without behavioral disturbance, psychotic disturbance, mood disturbance, and anxiety: F03.90

## 2020-08-14 LAB — CBC
HCT: 29.7 % — ABNORMAL LOW (ref 39.0–52.0)
Hemoglobin: 9.6 g/dL — ABNORMAL LOW (ref 13.0–17.0)
MCH: 32.7 pg (ref 26.0–34.0)
MCHC: 32.3 g/dL (ref 30.0–36.0)
MCV: 101 fL — ABNORMAL HIGH (ref 80.0–100.0)
Platelets: 266 10*3/uL (ref 150–400)
RBC: 2.94 MIL/uL — ABNORMAL LOW (ref 4.22–5.81)
RDW: 13.3 % (ref 11.5–15.5)
WBC: 11.1 10*3/uL — ABNORMAL HIGH (ref 4.0–10.5)
nRBC: 0 % (ref 0.0–0.2)

## 2020-08-14 LAB — URINALYSIS, ROUTINE W REFLEX MICROSCOPIC
Bacteria, UA: NONE SEEN
Bilirubin Urine: NEGATIVE
Glucose, UA: 150 mg/dL — AB
Hgb urine dipstick: NEGATIVE
Ketones, ur: NEGATIVE mg/dL
Leukocytes,Ua: NEGATIVE
Nitrite: NEGATIVE
Protein, ur: 30 mg/dL — AB
Specific Gravity, Urine: 1.011 (ref 1.005–1.030)
pH: 6 (ref 5.0–8.0)

## 2020-08-14 LAB — COMPREHENSIVE METABOLIC PANEL
ALT: 9 U/L (ref 0–44)
AST: 17 U/L (ref 15–41)
Albumin: 4.3 g/dL (ref 3.5–5.0)
Alkaline Phosphatase: 46 U/L (ref 38–126)
Anion gap: 11 (ref 5–15)
BUN: 80 mg/dL — ABNORMAL HIGH (ref 8–23)
CO2: 22 mmol/L (ref 22–32)
Calcium: 9.5 mg/dL (ref 8.9–10.3)
Chloride: 105 mmol/L (ref 98–111)
Creatinine, Ser: 5.08 mg/dL — ABNORMAL HIGH (ref 0.61–1.24)
GFR, Estimated: 10 mL/min — ABNORMAL LOW (ref >60)
Glucose, Bld: 126 mg/dL — ABNORMAL HIGH (ref 70–99)
Potassium: 4.3 mmol/L (ref 3.5–5.1)
Sodium: 138 mmol/L (ref 135–145)
Total Bilirubin: 0.2 mg/dL — ABNORMAL LOW (ref 0.3–1.2)
Total Protein: 7.4 g/dL (ref 6.5–8.1)

## 2020-08-14 LAB — RESP PANEL BY RT-PCR (FLU A&B, COVID) ARPGX2
Influenza A by PCR: NEGATIVE
Influenza B by PCR: NEGATIVE
SARS Coronavirus 2 by RT PCR: NEGATIVE

## 2020-08-14 MED ORDER — LORAZEPAM 0.5 MG PO TABS
0.5000 mg | ORAL_TABLET | Freq: Once | ORAL | Status: AC
  Administered 2020-08-15: 0.5 mg via ORAL
  Filled 2020-08-14: qty 1

## 2020-08-14 MED ORDER — AMLODIPINE BESYLATE 5 MG PO TABS
10.0000 mg | ORAL_TABLET | Freq: Every day | ORAL | Status: DC
  Administered 2020-08-15: 10 mg via ORAL
  Filled 2020-08-14 (×3): qty 2

## 2020-08-14 MED ORDER — METOPROLOL TARTRATE 25 MG PO TABS
25.0000 mg | ORAL_TABLET | Freq: Two times a day (BID) | ORAL | Status: DC
  Administered 2020-08-15: 25 mg via ORAL
  Filled 2020-08-14: qty 1

## 2020-08-14 MED ORDER — HYDRALAZINE HCL 25 MG PO TABS
50.0000 mg | ORAL_TABLET | Freq: Two times a day (BID) | ORAL | Status: DC
  Administered 2020-08-15 (×3): 50 mg via ORAL
  Filled 2020-08-14 (×4): qty 2

## 2020-08-14 NOTE — ED Triage Notes (Addendum)
Pt BIB EMS and [facility]. Pt IVC. Pt coming from his sisters house. Per [employer] "showing signs of cognitive impairment. Respondent has stage 5 kidney failure and is on several different medications. Pt was found today in his car driving back and forth in driveway confused and lost. Respondent is hard of hearing. Respondent has indicated to family members that something is not right with him. Respondent lives by himself and weill not listen to family members about his living conditions". Pt has hx of dementia. EMS reports pt cannot hear at all, responds well to written conversation. EMS reports pt is very confused, this is pts baseline.   152/60 100 HR 20 Resp CBG 160

## 2020-08-14 NOTE — ED Provider Notes (Signed)
[facility] DEPT Provider Note   CSN: [id number] Arrival date & time: 08/14/20  1856     History Chief Complaint  Patient presents with  . IVC    Jon Snyder is a 87 y.o. male.  HPI 87 yo male ho dementia, ckd, hoh presents today with ivc papers.  Daughter states he was confused at sisters house and had trouble driving.    Nephrologist [address] last creatinine 5.9 in January She reports that she went to dialysis appointment in January and he decided that since he would have to change doctors, he would not start dialysis. Dr. Olivia Mackie told her that at age 89, they don't usually discuss starting dialysis.  Past Medical History:  Diagnosis Date  . Chronic kidney disease, stage 5, kidney failure ([place])   . Dementia ([place])   . Hard of hearing     Patient Active Problem List   Diagnosis Date Noted  . Diarrhea 01/06/2017  . History of essential hypertension 07/08/2016    Past Surgical History:  Procedure Laterality Date  . EYE SURGERY Left    2018       No family history on file.  Social History   Tobacco Use  . Smoking status: Never Smoker  . Smokeless tobacco: Never Used  Substance Use Topics  . Alcohol use: No  . Drug use: No    Home Medications Prior to Admission medications   Medication Sig Start Date End Date Taking? Authorizing Provider  allopurinol (ZYLOPRIM) 100 MG tablet Take 100 mg by mouth 2 (two) times daily.    [provider]  amLODipine (NORVASC) 10 MG tablet Take 10 mg by mouth daily.    [provider]  Cholecalciferol (VITAMIN D3) 5000 units CAPS Take 5,000 Units by mouth daily.    [provider]  dicyclomine (BENTYL) 10 MG capsule Take one twice daily only if needed for diarrhea. 10/01/16   Posey Boyer, MD  furosemide (LASIX) 20 MG tablet Take 20 mg by mouth.    [provider]  hydrALAZINE (APRESOLINE) 25 MG tablet Take 25 mg by mouth daily.     [provider]  loperamide (IMODIUM) 2 MG capsule Take 1 capsule (2 mg total) by mouth as needed for diarrhea or loose stools. 01/06/17   Horald Pollen, MD  metoprolol tartrate (LOPRESSOR) 25 MG tablet Take 25 mg by mouth 2 (two) times daily.    [provider]  olmesartan (BENICAR) 40 MG tablet Take 40 mg by mouth daily.    [provider]    Allergies    Patient has no known allergies.  Review of Systems   Review of Systems  Unable to perform ROS: Dementia    Physical Exam Updated Vital Signs BP (!) 142/59 (BP Location: Right Arm)   Pulse 93   Temp 97.7 F (36.5 C) (Oral)   Resp 16   SpO2 95%   Physical Exam Vitals reviewed.  Constitutional:      General: He is not in acute distress.    Appearance: He is ill-appearing.  HENT:     Head: Normocephalic.     Right Ear: External ear normal.     Left Ear: External ear normal.     Nose: Nose normal.     Mouth/Throat:     Pharynx: Oropharynx is clear.  Eyes:     Pupils: Pupils are equal, round, and reactive to light.  Cardiovascular:     Rate and Rhythm: Normal  rate.     Pulses: Normal pulses.  Pulmonary:     Effort: Pulmonary effort is normal.  Abdominal:     General: Abdomen is flat. Bowel sounds are normal.  Musculoskeletal:        General: Normal range of motion.     Cervical back: Normal range of motion.  Skin:    General: Skin is warm and dry.     Capillary Refill: Capillary refill takes less than 2 seconds.  Neurological:     General: No focal deficit present.     Mental Status: He is alert.  Psychiatric:        Mood and Affect: Mood normal.     ED Results / Procedures / Treatments   Labs (all labs ordered are listed, but only abnormal results are displayed) Labs Reviewed - No data to display  EKG None  Radiology No results found.  Procedures Procedures   Medications Ordered in ED Medications - No data to display  ED Course  I have reviewed the triage vital signs  and the nursing notes.  Pertinent labs & imaging results that were available during my care of the patient were reviewed by me and considered in my medical decision making (see chart for details).    MDM Rules/Calculators/A&P                          87 yo male very hoh,presents with questions about cognitive abiities, ivc'd by daughter after he was seen pulling up and backing up car many times.  Evaluation her with stable labs including renal function. Suspect that much of this is due to extreme hoh and appears to communicate better via email or writing Plan toc in am Contact daughter [phone]  Final Clinical Impression(s) / ED Diagnoses Final diagnoses:  Confusion  Bilateral hearing loss, unspecified hearing loss type  Chronic kidney disease, unspecified CKD stage    Rx / DC Orders ED Discharge Orders    None       Pattricia Boss, MD 08/14/20 2323

## 2020-08-15 DIAGNOSIS — R410 Disorientation, unspecified: Secondary | ICD-10-CM | POA: Diagnosis not present

## 2020-08-15 MED ORDER — METOPROLOL SUCCINATE ER 50 MG PO TB24
50.0000 mg | ORAL_TABLET | Freq: Every day | ORAL | Status: DC
  Administered 2020-08-15: 50 mg via ORAL
  Filled 2020-08-15 (×2): qty 1

## 2020-08-15 MED ORDER — TUBERCULIN PPD 5 UNIT/0.1ML ID SOLN
5.0000 [IU] | INTRADERMAL | Status: DC
  Administered 2020-08-15: 5 [IU] via INTRADERMAL
  Filled 2020-08-15: qty 0.1

## 2020-08-15 MED ORDER — LORAZEPAM 2 MG/ML IJ SOLN
1.0000 mg | Freq: Once | INTRAMUSCULAR | Status: AC
  Administered 2020-08-15: 1 mg via INTRAMUSCULAR
  Filled 2020-08-15: qty 1

## 2020-08-15 MED ORDER — LORAZEPAM 2 MG/ML IJ SOLN
1.0000 mg | INTRAMUSCULAR | Status: DC | PRN
  Administered 2020-08-16: 1 mg via INTRAMUSCULAR
  Filled 2020-08-15: qty 1

## 2020-08-15 MED ORDER — HALOPERIDOL LACTATE 5 MG/ML IJ SOLN
2.0000 mg | Freq: Once | INTRAMUSCULAR | Status: AC
  Administered 2020-08-15: 2 mg via INTRAMUSCULAR
  Filled 2020-08-15: qty 1

## 2020-08-15 NOTE — ED Notes (Signed)
Pt given meal tray.

## 2020-08-15 NOTE — Progress Notes (Signed)
TOC CSW emailed a referral to [employer] for [employer].    Sujata Maines Tarpley-Carter, MSW, LCSW-A Pronouns:  She, Her, Hers                  [facility] Transitions of CareClinical Social Worker [email or web address] [phone]

## 2020-08-15 NOTE — ED Notes (Signed)
Pt had gotten up out of the bed. Redirected into bed and bed alarm is on. Kavin Leech, NT

## 2020-08-15 NOTE — ED Notes (Signed)
Pt's daughter called.  Jon Snyder. Cell [phone]. House phone:  [phone].  She said to please leave voice mail if she does not answer.  Would like the opportunity to be present during the Transition of Care consult to assist with planning.

## 2020-08-15 NOTE — ED Notes (Signed)
Patient is asleep.  

## 2020-08-15 NOTE — ED Notes (Signed)
Patient is sitting up in bed.

## 2020-08-15 NOTE — Progress Notes (Addendum)
..   Transition of Care [facility]) - Emergency Department Mini Assessment   Patient Details  Name: Jon Snyder MRN: [id number] Date of Birth: 1924-07-06  Transition of Care [facility]) CM/SW Contact:    Erenest Rasher, RN Phone Number:[phone] 08/15/2020, 4:25 PM   Clinical Narrative: TOC CM spoke to dtr, Benita and states she wants pt to go to [facility]. She has spoken to [place] and they are requesting FL2 and COVID test. Contacted [facility], Spoke to [place]. States they will need to complete an assessment via Facetime on tomorrow at 9 am.   501 pm TOC CM spoke to dtr on phone and explained she can arrange Memory Care from home setting. Explained [facility] states they do not admit on Friday or Weekends. States pt lives alone in a very bad neighborhood and states she is not going to hire a caregiver even with CM arranging [place]. Dtr has declined to take pt home with [facility], and set Memory Care from community. TOC CM faxed referral to [facility], TOC CM/CSW will follow up in am with [facility]. ED provider and ED RN updated. TB skin test requested.    ED Mini Assessment: What brought you to the Emergency Department? : confusion  Barriers to Discharge: Unsafe home situation,Other (comment) (placement)  Barrier interventions: waiting Memory Care Placement  Means of departure: Ambulance  Interventions which prevented an admission or readmission: Other (must enter comment) (Memory Care Placement)    Patient Contact and Communications Key Contact 1: [place] with: daughter Contact Date: 08/15/20,   Contact time: 41 Contact Phone Number: [phone] Call outcome: spoke with daughter regarding placement  Patient states their goals for this hospitalization and ongoing recovery are:: Pt is comfortable and compliant. CMS Medicare.gov Compare Post Acute Care list provided to:: Patient Represenative (must comment) Choice offered to / list  presented to : NA  Admission diagnosis:  IVC Patient Active Problem List   Diagnosis Date Noted  . Diarrhea 01/06/2017  . History of essential hypertension 07/08/2016   PCP:  Thereasa Distance, MD Pharmacy:   [facility], [place] [address] Phone: [phone] Fax: [phone]  [facility] [place], [place] [address] Phone: [phone] Fax: [phone]  [facility] [place], [place] - [address] AT [facility] OF [address] [place] [address] Phone: [phone] Fax: [phone]

## 2020-08-15 NOTE — NC FL2 (Signed)
[place] LEVEL OF CARE SCREENING TOOL     IDENTIFICATION  Patient Name: Jon Snyder Birthdate: Feb 13, 1925 Sex: male Admission Date (Current Location): 08/14/2020  [facility] and [place] Number:  Herbalist and Address:  [facility],  [place] [address], [place]      Provider Number: [id number]  Attending Physician Name and Address:  Default, Provider, MD  Relative Name and Phone Number:  [name] # [phone]    Current Level of Care: Hospital Recommended Level of Care: Memory Care Prior Approval Number:    Date Approved/Denied:   PASRR Number:    Discharge Plan: Other (Comment) (Memory Care)    Current Diagnoses: Patient Active Problem List   Diagnosis Date Noted  . Diarrhea 01/06/2017  . History of essential hypertension 07/08/2016    Orientation RESPIRATION BLADDER Height & Weight     Self,Place  Normal Continent Weight: 5'5" Height:  66.8 kg  BEHAVIORAL SYMPTOMS/MOOD NEUROLOGICAL BOWEL NUTRITION STATUS      Continent Diet (Regular)  AMBULATORY STATUS COMMUNICATION OF NEEDS Skin   Supervision Verbally Normal                       Personal Care Assistance Level of Assistance  Bathing,Feeding,Dressing Bathing Assistance: Limited assistance Feeding assistance: Independent Dressing Assistance: Limited assistance     Functional Limitations Info  Sight,Hearing,Speech Sight Info: Adequate Hearing Info: Impaired Speech Info: Adequate    SPECIAL CARE FACTORS FREQUENCY  PT (By licensed PT),OT (By licensed OT)     PT Frequency: 3x per week OT Frequency: 3x per week            Contractures Contractures Info: Not present    Additional Factors Info  Code Status,Allergies Code Status Info: Full Code Allergies Info: No Known Allergies           Current Medications (08/15/2020):  This is the current hospital active medication list Current Facility-Administered Medications  Medication Dose  Route Frequency Provider Last Rate Last Admin  . amLODipine (NORVASC) tablet 10 mg  10 mg Oral Daily Pattricia Boss, MD   10 mg at 08/15/20 1015  . hydrALAZINE (APRESOLINE) tablet 50 mg  50 mg Oral BID Pattricia Boss, MD   50 mg at 08/15/20 1015  . metoprolol succinate (TOPROL-XL) 24 hr tablet 50 mg  50 mg Oral Daily Pattricia Boss, MD   50 mg at 08/15/20 1015   Current Outpatient Medications  Medication Sig Dispense Refill  . allopurinol (ZYLOPRIM) 100 MG tablet Take 200 mg by mouth daily.    Marland Kitchen amLODipine (NORVASC) 10 MG tablet Take 10 mg by mouth daily.    Marland Kitchen aspirin EC 81 MG tablet Take 81 mg by mouth as needed for mild pain (or headaches). Swallow whole.    . Cholecalciferol (VITAMIN D3) 5000 units CAPS Take 5,000 Units by mouth daily.    . colchicine 0.6 MG tablet Take 0.6-1.2 mg by mouth See admin instructions. Take 1.2 mg by mouth at onset of gout flare (first dose), then 0.6 mg in 1-2 hours after first dose, then stop.    . ferrous sulfate 325 (65 FE) MG EC tablet Take 325 mg by mouth daily with breakfast.    . furosemide (LASIX) 20 MG tablet Take 40 mg by mouth 2 (two) times daily.    . hydrALAZINE (APRESOLINE) 50 MG tablet Take 50 mg by mouth 2 (two) times daily.    Marland Kitchen latanoprost (XALATAN) 0.005 % ophthalmic solution  Place 1 drop into both eyes at bedtime.    Marland Kitchen loperamide (IMODIUM) 2 MG capsule Take 1 capsule (2 mg total) by mouth as needed for diarrhea or loose stools. (Patient taking differently: Take 2 mg by mouth 4 (four) times daily as needed for diarrhea or loose stools.) 30 capsule 0  . metoprolol succinate (TOPROL-XL) 50 MG 24 hr tablet Take 50 mg by mouth daily. Take with or immediately following a meal.    . vitamin B-12 (CYANOCOBALAMIN) 1000 MCG tablet Take 1,000 mcg by mouth daily.    Marland Kitchen dicyclomine (BENTYL) 10 MG capsule Take one twice daily only if needed for diarrhea. (Patient not taking: Reported on 08/15/2020) 10 capsule 0  . hydrALAZINE (APRESOLINE) 25 MG tablet Take 25 mg  by mouth daily.     . metoprolol tartrate (LOPRESSOR) 25 MG tablet Take 25 mg by mouth 2 (two) times daily. (Patient not taking: Reported on 08/15/2020)    . olmesartan (BENICAR) 40 MG tablet Take 40 mg by mouth daily. (Patient not taking: No sig reported)       Discharge Medications: Please see discharge summary for a list of discharge medications.  Relevant Imaging Results:  Relevant Lab Results:   Additional Information SS # [id number] vaccinated  Erenest Rasher, RN

## 2020-08-15 NOTE — Progress Notes (Signed)
08/15/2020  1743  RT Forearm placed TB skin test.

## 2020-08-15 NOTE — Progress Notes (Signed)
Resp Panel by RT-PCR (Flu A&B, Covid) Nasopharyngeal Swab (Order [id number])   Resp Panel by RT-PCR (Flu A&B, Covid) Nasopharyngeal Swab Order: [id number]  Status: Final result    Visible to patient: Yes (not seen)    Next appt: None   Specimen Information: Nasopharyngeal Swab; Nasopharyngeal(NP) swabs in vial transport medium      0 Result Notes    Ref Range & Units 1 d ago  SARS Coronavirus 2 by RT PCR NEGATIVE NEGATIVE   Comment: (NOTE)  SARS-CoV-2 target nucleic acids are NOT DETECTED.   The SARS-CoV-2 RNA is generally detectable in upper respiratory  specimens during the acute phase of infection. The lowest  concentration of SARS-CoV-2 viral copies this assay can detect is  138 copies/mL. A negative result does not preclude SARS-Cov-2  infection and should not be used as the sole basis for treatment or  other patient management decisions. A negative result may occur with  improper specimen collection/handling, submission of specimen other  than nasopharyngeal swab, presence of viral mutation(s) within the  areas targeted by this assay, and inadequate number of viral  copies(<138 copies/mL). A negative result must be combined with  clinical observations, patient history, and epidemiological  information. The expected result is Negative.   Fact Sheet for Patients:  EntrepreneurPulse.com.au   Fact Sheet for Healthcare Providers:  IncredibleEmployment.be   This test is not yet approved or cleared by the [place] FDA and  has been authorized for detection and/or diagnosis of SARS-CoV-2 by  FDA under an Emergency Use Authorization (EUA). This EUA will remain  in effect (meaning this test can be used) for the duration of the  COVID-19 declaration under Section 564(b)(1) of the Act, 21  U.S.C.section 360bbb-3(b)(1), unless the authorization is terminated  or revoked sooner.     Influenza A by PCR NEGATIVE NEGATIVE    Influenza B by PCR NEGATIVE NEGATIVE   Comment: (NOTE)  The Xpert Xpress SARS-CoV-2/FLU/RSV plus assay is intended as an aid  in the diagnosis of influenza from Nasopharyngeal swab specimens and  should not be used as a sole basis for treatment. Nasal washings and  aspirates are unacceptable for Xpert Xpress SARS-CoV-2/FLU/RSV  testing.   Fact Sheet for Patients:  EntrepreneurPulse.com.au   Fact Sheet for Healthcare Providers:  IncredibleEmployment.be   This test is not yet approved or cleared by the [place] FDA and  has been authorized for detection and/or diagnosis of SARS-CoV-2 by  FDA under an Emergency Use Authorization (EUA). This EUA will remain  in effect (meaning this test can be used) for the duration of the  COVID-19 declaration under Section 564(b)(1) of the Act, 21 U.S.C.  section 360bbb-3(b)(1), unless the authorization is terminated or  revoked.   Performed at [facility], [place] [address]  [place], [place] [address]   Resulting Agency  [facility] LAB          Specimen Collected: 08/14/20 19:29 Last Resulted: 08/14/20 20:59      Lab Flowsheet     Order Details     View Encounter     Lab and Collection Details     Routing     Result History         Result Care Coordination    Patient Communication   Add Comments  Add Notifications Back to Top        Other Results from 08/14/2020   Contains abnormal dataUrinalysis, Routine w reflex microscopic Urine, Clean Catch Order: [id number]  Status: Final result    Visible to patient: Yes (not seen)    Next appt: None    0 Result Notes   Component Ref Range & Units 1 d ago 6 yr ago  Color, Urine YELLOW STRAWAbnormal  YELLOW   APPearance CLEAR CLEAR  CLEAR   Specific Gravity, Urine 1.005 - 1.030 1.011  1.012   pH 5.0 - 8.0 6.0  7.0   Glucose, UA NEGATIVE mg/dL 150Abnormal  NEGATIVE   Hgb urine dipstick NEGATIVE  NEGATIVE  NEGATIVE   Bilirubin Urine NEGATIVE NEGATIVE  NEGATIVE   Ketones, ur NEGATIVE mg/dL NEGATIVE  NEGATIVE   Protein, ur NEGATIVE mg/dL 30Abnormal  NEGATIVE   Nitrite NEGATIVE NEGATIVE  NEGATIVE   Leukocytes,Ua NEGATIVE NEGATIVE    RBC / HPF 0 - 5 RBC/hpf 0-5    WBC, UA 0 - 5 WBC/hpf 0-5    Bacteria, UA NONE SEEN NONE SEEN    Mucus  PRESENT    Comment: Performed at [facility], [place] [address] [place], [place] [address]  Urobilinogen, UA   0.2 R   Leukocytes, UA   NEGATIVE R, CM   Resulting Agency  [place] [facility]          Specimen Collected: 08/14/20 19:29 Last Resulted: 08/14/20 20:25      Lab Flowsheet     Order Details     View Encounter     Lab and Collection Details     Routing     Result History      CM=Additional commentsR=Reference range differs from displayed range     Result Care Coordination    Patient Communication   Add Comments  Add Notifications Back to Top          Contains abnormal dataCBC Order: [id number]  Status: Final result    Visible to patient: Yes (not seen)    Next appt: None    0 Result Notes   Component Ref Range & Units 1 d ago  (08/14/20) 3 yr ago  (01/06/17) 3 yr ago  (10/01/16) 6 yr ago  (08/19/13) 6 yr ago  (08/19/13)  WBC 4.0 - 10.5 K/uL 11.1High  9.2 R  9.7 R  7.6    RBC 4.22 - 5.81 MIL/uL 2.94Low  3.80Abnormal R  3.76Abnormal R  4.31    Hemoglobin 13.0 - 17.0 g/dL 9.6Low  12.8Abnormal R  12.4Abnormal R  13.6  16.0   HCT 39.0 - 52.0 % 29.7Low  36.9Abnormal R  35.9Abnormal R  41.5  47.0   MCV 80.0 - 100.0 fL 101.0High  97.3Abnormal R  95.3 R  96.3 R    MCH 26.0 - 34.0 pg 32.7  33.7Abnormal R  32.9Abnormal R  31.6    MCHC 30.0 - 36.0 g/dL 32.3  34.6 R  34.5 R  32.8    RDW 11.5 - 15.5 % 13.3    12.5    Platelets 150 - 400 K/uL 266    202    nRBC 0.0 - 0.2 % 0.0       Comment: Performed at [facility], [place]  [address], [place], [place] [address]  Lymph, poc   2.1 R  2.6 R     Neutrophils Relative %     78High R    Neutro Abs     5.9 R    Lymphocytes Relative     14 R    Lymphs Abs     1.1 R  Monocytes Relative     7 R    Monocytes Absolute     0.6 R    Eosinophils Relative     1 R    Eosinophils Absolute     0.1 R    Basophils Relative     0 R    Basophils Absolute     0.0 R    POC LYMPH PERCENT   22.7 R  27.2 R     Sodium      144 R   Potassium      4.5 R   Chloride      103 R   BUN      37High R   Creatinine, Ser      2.40High R   Glucose, Bld      131High R   Calcium, Ion      1.20 R   TCO2      29 R   MID (cbc)   1.6Abnormal R  0.8 R     POC MID %   17.0Abnormal R  8.5 R     POC Granulocyte   5.5 R  6.2 R     Granulocyte percent   60.3 R  64.3 R     RDW, POC   14.4 R  14.9 R     Platelet Count, POC   205 R  247 R     MPV   7.3 R  7.2 R     Resulting Agency  [place] [facility]   [facility]          Specimen Collected: 08/14/20 19:29 Last Resulted: 08/14/20 20:14      Lab Flowsheet     Order Details     View Encounter     Lab and Collection Details     Routing     Result History      R=Reference range differs from displayed range     Result Care Coordination    Patient Communication   Add Comments  Add Notifications Back to Top          Contains abnormal dataComprehensive metabolic panel Order: [id number]  Status: Final result    Visible to patient: Yes (not seen)    Next appt: None    0 Result Notes   Component Ref Range & Units 1 d ago  (08/14/20) 3 yr ago  (01/06/17) 3 yr ago  (10/01/16) 6 yr ago  (08/19/13) 6 yr ago  (08/19/13)  Sodium 135 - 145 mmol/L 138  139 R  143 R  142 R  144 R   Potassium 3.5 - 5.1 mmol/L 4.3  4.3 R  5.0 R  5.2 R  4.5 R   Chloride 98 - 111 mmol/L 105  104 R  108High R  102 R  103 R   CO2 22 - 32 mmol/L 22  21 R  18Low R, CM  28 R    Glucose, Bld 70 - 99 mg/dL 126High  97 R   116High R  116High  131High   Comment: Glucose reference range applies only to samples taken after fasting for at least 8 hours.  BUN 8 - 23 mg/dL 80High  35 R  50High R  39High R  37High R   Creatinine, Ser 0.61 - 1.24 mg/dL 5.08High  2.31High R  2.58High R  2.22High R  2.40High R   Calcium 8.9 - 10.3 mg/dL 9.5  9.1 R  9.7 R  9.6 R    Total Protein 6.5 - 8.1 g/dL 7.4  6.9 R  7.3 R     Albumin 3.5 - 5.0 g/dL 4.3  4.5 R  4.7 R     AST 15 - 41 U/L 17  21 R  28 R     ALT 0 - 44 U/L 9  13 R  19 R     Alkaline Phosphatase 38 - 126 U/L 46  57 R  60 R     Total Bilirubin 0.3 - 1.2 mg/dL 0.2Low  0.5 R  0.5 R     GFR, Estimated >60 mL/min 10Low       Comment: (NOTE)  Calculated using the CKD-EPI Creatinine Equation (2021)   Anion gap 5 - 15 11       Comment: Performed at [facility], [place] [address] [place], [place] [address]  Resulting Agency  [place] [facility] [place] [facility]          Specimen Collected: 08/14/20 19:29 Last Resulted: 08/14/20 20:52      Lab Flowsheet     Order Details     View Encounter     Lab and Collection Details     Routing     Result History      CM=Additional commentsR=Reference range differs from displayed range     Result Care Coordination    Patient Communication   Add Comments  Add Notifications Back to Top        Ordering Comments  Not on file   Order Questions  Question Answer  Is this test for diagnosis or screening Screening  Symptomatic for COVID-19 as defined by CDC No  Note: Fever or chills, Cough, Shortness of breath or difficulty breathing, Fatigue, Muscle or body aches, Headache, New loss of taste or smell, Sore throat, Congestion or runny nose, Nausea or vomiting, Diarrhea. Question required by [place] Department of Health and Human Services based on CARES Act  Hospitalized for COVID-19 No  Note: Question from the [place] Department of Health and [employer] based on the [employer].  Admitted to ICU for COVID-19 No  Note: Question from the [place] Department of Health and [employer] based on the [place].  Previously tested for COVID-19 No  Note: Question from the [place] Department of Health and [employer] based on the [place].  Resident in a congregate (group) care setting No  Note: Nursing home, residential care location for people with intellectual and developmental disabilities, psychiatric treatment facility, group home, dormitory, board and care home, homeless shelter, foster care or other setting. Question required by [place] Department of Health and [employer] based on the CARES Act.  Employed in healthcare setting No  Note: First responders, front line clinicians, nursing home staff, environmental staff, or therapists in direct contact with patients. Question required by [place] Department of Health and [employer] based on the CARES Act.  Has patient completed COVID vaccination(s) (2 doses of Pfizer/Moderna 1 dose of [employer]) Unknown  Note: Question from the [place] Department of Health and [employer] based on the [employer].        Resp Panel by RT-PCR (Flu A&B, Covid) Nasopharyngeal Swab: Patient Communication   Add Comments  Add Notifications     Encounter  View Encounter        Result Information  Status: Final result (Collected: 08/14/2020 19:29) Provider Status: Ordered     Lab Information  [facility]      Order-Level Documents:  There are no order-level documents.  View SmartLink Info  Resp Panel by RT-PCR (Flu A&B, Covid) Nasopharyngeal Swab (Order #[id number]) on 08/14/20   Result Read / Acknowledged  Acknowledge result No acknowledgement history exists for this order.    Resp Panel by RT-PCR (Flu A&B, Covid) Nasopharyngeal Swab: Patient Communication   Add Comments  Add Notifications

## 2020-08-15 NOTE — ED Provider Notes (Incomplete)
Jon Snyder Provider Note   CSN: [id number] Arrival date & time: 08/14/20  1856     History Chief Complaint  Patient presents with  . IVC    Jon Snyder is a 87 y.o. male.  HPI     Past Medical History:  Diagnosis Date  . Chronic kidney disease, stage 5, kidney failure ([place])   . Dementia ([place])   . Hard of hearing     Patient Active Problem List   Diagnosis Date Noted  . Diarrhea 01/06/2017  . History of essential hypertension 07/08/2016    Past Surgical History:  Procedure Laterality Date  . EYE SURGERY Left    2018       History reviewed. No pertinent family history.  Social History   Tobacco Use  . Smoking status: Never Smoker  . Smokeless tobacco: Never Used  Substance Use Topics  . Alcohol use: No  . Drug use: No    Home Medications Prior to Admission medications   Medication Sig Start Date End Date Taking? Authorizing Provider  allopurinol (ZYLOPRIM) 100 MG tablet Take 200 mg by mouth daily.   Yes [provider]  amLODipine (NORVASC) 10 MG tablet Take 10 mg by mouth daily.   Yes [provider]  aspirin EC 81 MG tablet Take 81 mg by mouth as needed for mild pain (or headaches). Swallow whole.   Yes [provider]  Cholecalciferol (VITAMIN D3) 5000 units CAPS Take 5,000 Units by mouth daily.   Yes [provider]  colchicine 0.6 MG tablet Take 0.6-1.2 mg by mouth See admin instructions. Take 1.2 mg by mouth at onset of gout flare (first dose), then 0.6 mg in 1-2 hours after first dose, then stop.   Yes [provider]  ferrous sulfate 325 (65 FE) MG EC tablet Take 325 mg by mouth daily with breakfast.   Yes [provider]  furosemide (LASIX) 20 MG tablet Take 40 mg by mouth 2 (two) times daily.   Yes [provider]  hydrALAZINE (APRESOLINE) 50 MG tablet Take 50 mg by mouth 2 (two) times daily.   Yes [provider]  latanoprost (XALATAN)  0.005 % ophthalmic solution Place 1 drop into both eyes at bedtime.   Yes [provider]  loperamide (IMODIUM) 2 MG capsule Take 1 capsule (2 mg total) by mouth as needed for diarrhea or loose stools. Patient taking differently: Take 2 mg by mouth 4 (four) times daily as needed for diarrhea or loose stools. 01/06/17  Yes Sagardia, Ines Bloomer, MD  metoprolol succinate (TOPROL-XL) 50 MG 24 hr tablet Take 50 mg by mouth daily. Take with or immediately following a meal.   Yes [provider]  vitamin B-12 (CYANOCOBALAMIN) 1000 MCG tablet Take 1,000 mcg by mouth daily.   Yes [provider]  dicyclomine (BENTYL) 10 MG capsule Take one twice daily only if needed for diarrhea. Patient not taking: Reported on 08/15/2020 10/01/16   Posey Boyer, MD  hydrALAZINE (APRESOLINE) 25 MG tablet Take 25 mg by mouth daily.     [provider]  metoprolol tartrate (LOPRESSOR) 25 MG tablet Take 25 mg by mouth 2 (two) times daily. Patient not taking: Reported on 08/15/2020    [provider]  olmesartan (BENICAR) 40 MG tablet Take 40 mg by mouth daily. Patient not taking: No sig reported    [provider]    Allergies    Patient has no known allergies.  Review  of Systems   Review of Systems  Physical Exam Updated Vital Signs BP 102/60   Pulse 76   Temp 98.2 F (36.8 C) (Axillary) Comment (Src): pt unable to perform oral temp  Resp 16   SpO2 92%   Physical Exam  ED Results / Procedures / Treatments   Labs (all labs ordered are listed, but only abnormal results are displayed) Labs Reviewed  URINALYSIS, ROUTINE W REFLEX MICROSCOPIC - Abnormal; Notable for the following components:      Result Value   Color, Urine STRAW (*)    Glucose, UA 150 (*)    Protein, ur 30 (*)    All other components within normal limits  CBC - Abnormal; Notable for the following components:   WBC 11.1 (*)    RBC 2.94 (*)    Hemoglobin 9.6 (*)    HCT 29.7 (*)    MCV  101.0 (*)    All other components within normal limits  COMPREHENSIVE METABOLIC PANEL - Abnormal; Notable for the following components:   Glucose, Bld 126 (*)    BUN 80 (*)    Creatinine, Ser 5.08 (*)    Total Bilirubin 0.2 (*)    GFR, Estimated 10 (*)    All other components within normal limits  RESP PANEL BY RT-PCR (FLU A&B, COVID) [id number]    EKG None  Radiology DG Chest Port 1 View  Result Date: 08/14/2020 CLINICAL DATA:  Altered mental status EXAM: PORTABLE CHEST 1 VIEW COMPARISON:  None. FINDINGS: Cardiac shadow is enlarged. Aortic calcifications are noted. Mild patchy atelectatic changes are noted in the left base. No acute bony abnormality is noted. IMPRESSION: Mild left basilar atelectasis. Electronically Signed   By: Inez Catalina M.D.   On: 08/14/2020 19:57    Procedures Procedures {Remember to document critical care time when appropriate:1}  Medications Ordered in ED Medications  amLODipine (NORVASC) tablet 10 mg (10 mg Oral Given 08/15/20 1015)  hydrALAZINE (APRESOLINE) tablet 50 mg (50 mg Oral Given 08/15/20 1015)  metoprolol succinate (TOPROL-XL) 24 hr tablet 50 mg (50 mg Oral Given 08/15/20 1015)  LORazepam (ATIVAN) tablet 0.5 mg (0.5 mg Oral Given 08/15/20 0130)    ED Course  I have reviewed the triage vital signs and the nursing notes.  Pertinent labs & imaging results that were available during my care of the patient were reviewed by me and considered in my medical decision making (see chart for details).    MDM Rules/Calculators/A&P                          *** Final Clinical Impression(s) / ED Diagnoses Final diagnoses:  Confusion  Bilateral hearing loss, unspecified hearing loss type  Chronic kidney disease, unspecified CKD stage    Rx / DC Orders ED Discharge Orders    None

## 2020-08-15 NOTE — Progress Notes (Addendum)
..   Transition of Care [facility]) - Emergency Department Mini Assessment   Patient Details  Name: Jon Snyder MRN: [id number] Date of Birth: 1924/08/28  Transition of Care [facility]) CM/SW Contact:    Caylor Tallarico C Tarpley-Carter, [place] Phone Number: 08/15/2020, 10:16 AM   Clinical Narrative: TOC CSW spoke with pt at bedside with Shauna Hugh, RN.  Pt was very pleasant and compliant.  Tayana Shankle Tarpley-Carter, MSW, LCSW-A Pronouns:  She, Her, [place] ED Transitions of CareClinical Social Worker [email or web address] [phone]   ED Mini Assessment: What brought you to the Emergency Department? : IVC  Barriers to Discharge: No Barriers Identified     Means of departure: Ambulance  Interventions which prevented an admission or readmission: Other (must enter comment) (TOC Evaluation)    Patient Contact and Communications Key Contact 1: Benita McFarland   Spoke with: Benita Contact Date: 08/15/20,     Contact Phone Number: [phone] Call outcome: CSW spoke with Benita in regards to pts mental capacity.  Patient states their goals for this hospitalization and ongoing recovery are:: Pt is comfortable and compliant.   Choice offered to / list presented to : NA  Admission diagnosis:  IVC Patient Active Problem List   Diagnosis Date Noted  . Diarrhea 01/06/2017  . History of essential hypertension 07/08/2016   PCP:  Thereasa Distance, MD Pharmacy:   [facility], [place] [address] Phone: [phone] Fax: [phone]  [facility] Delivery - [place], [place] [address] Phone: [phone] Fax: [phone]  [facility] [place], [place] - [place] AT [facility] [address] [place] [address] Phone: [phone] Fax: [phone]

## 2020-08-15 NOTE — ED Notes (Signed)
Pt's daughter said that pt has been tentatively accepted to [facility] tomorrow if they can get an FL2 completed (fax to [phone]) and plan to do a Zoom meeting today at 3:00 with pt and his daughter.  His daughter is planning to be here at 3.  Also they want to see a TB screening within one year if available.  If not they will do it themselves tomorrow.  Per his daughter.  SW, Ricquita informed through secure messaging.

## 2020-08-15 NOTE — ED Notes (Signed)
Clothing placed in hospital bag in cabinet associated with room 25.  Pt label on the bag.

## 2020-08-15 NOTE — ED Provider Notes (Signed)
Call from nurse concerning patient being agitated and not following commands.  He will not stay in his bed.  He received Ativan to help agitation at 1:30 AM today.  Record review indicates that [facility] is working on discharge management with possible return home, after clearing with family members.  Will give trial of Haldol 2 mg IM.   Daleen Bo, MD 08/15/20 2011

## 2020-08-15 NOTE — ED Notes (Signed)
Pt sitting up eating lunch. Calm, cooperative and pleasant.

## 2020-08-15 NOTE — ED Provider Notes (Addendum)
Emergency Medicine Observation Re-evaluation Note  Phil Michels is a 87 y.o. male, seen on rounds today.  Pt initially presented to the ED for complaints of IVC Currently, the patient is resting comfortably.  Physical Exam  BP 121/67   Pulse 60   Temp 98.2 F (36.8 C) (Rectal)   Resp 18   SpO2 98%  Physical Exam General: No distress Lungs: Resp even and unlabored Psych: Sleeping soundly  ED Course / MDM  EKG:   I have reviewed the labs performed to date as well as medications administered while in observation.  Recent changes in the last 24 hours include none.  Plan  Current plan is for [facility] evaluation. Patient is under full IVC at this time.   Truddie Hidden, MD 08/15/20 [phone]  Addendum: Patient seen by TOC/SW. Tentative plan is discharge home with Home Health. Awaiting confirmation from SW that family is amenable to this plan. IVC is rescinded. No psych diagnosis.    Truddie Hidden, MD 08/15/20 1524

## 2020-08-15 NOTE — ED Notes (Signed)
Pt alert and pleasant. Able to feed self with set up.

## 2020-08-15 NOTE — Progress Notes (Signed)
TOC CSW spoke with pts daughter/Benita McFarland [phone].  Benita expressed concern with pts next steps for a continued quality of life.  [name] is concerned with pt living a lone and having the capacity to care for himself.  Benita also inquired about AFL and HH.  CSW will provide Benita and pt with a referral to [facility].  Benita was appreciative of this referral.  CSW provided Benita with contact information for any future concerns.  Keaundra Stehle Tarpley-Carter, MSW, LCSW-A Pronouns:  She, Her, [facility] ED Transitions of CareClinical Social Worker [email or web address] [phone]

## 2020-08-15 NOTE — ED Notes (Signed)
Pt attempting to get out of bed several times. Pt assisted using the urinal and redirected back to bed. Pt is hard of hearing and difficult to communicate with.

## 2020-08-16 DIAGNOSIS — R410 Disorientation, unspecified: Secondary | ICD-10-CM | POA: Diagnosis not present

## 2020-08-16 NOTE — ED Notes (Signed)
Pt DC d off unit to facility per provider. Pt alert, cooperative ,no s/s of distress. DC information given to daughter for facility . Pt off unit in w/c, escorted by RN and NT. Pt transported by family member.

## 2020-08-16 NOTE — Progress Notes (Signed)
Attempted vital signs. Patient refused

## 2020-08-16 NOTE — Progress Notes (Signed)
TOC CM contacted [facility] for RW for facility. Spoke to pt's dtr and she will provide transportation. [place], [place] ED TOC CM [phone]

## 2020-08-16 NOTE — Discharge Instructions (Addendum)
Please return for any problem.  °

## 2020-08-16 NOTE — ED Notes (Signed)
Pt refused VS at this time .

## 2020-08-16 NOTE — Progress Notes (Signed)
Chaplain engaged in an initial visit with Mrs. McFarland, Zollie's daughter.  She wanted to get a healthcare POA completed.  Chaplain went to assess if Kaven would be able to sign the documents and found that he was not oriented enough at this time.  Chaplain spoke with nurse about his orientation who also noted that he was not oriented enough due to being very sleepy and out of it.  Chaplain explained to daughter that because she is his next-of-kin that she already has rights in decision making regarding Airon's health.  Chaplain worked to affirm that she did not need the Advanced Directive completed in terms of being involved in his discharge or what facility he would enter into.    Daughter is working to get a number of things in order and chaplain worked to offer listening and assurance.    Chaplain is available to follow-up as needed.   08/16/20 1100  Clinical Encounter Type  Visited With Patient and family together  Visit Type Initial;Social support

## 2020-08-16 NOTE — Progress Notes (Signed)
TOC CSW faxed FL2, immunization record, and COVID test to [facility] and [name] at [employer] [phone].    Jon Snyder, MSW, LCSW-A Pronouns:  She, Her, Hers                  [place] ED Transitions of CareClinical Social Worker [email or web address] [phone]

## 2020-10-10 ENCOUNTER — Ambulatory Visit: Payer: Medicare Other | Admitting: Podiatry

## 2020-10-10 ENCOUNTER — Ambulatory Visit: Payer: Medicare Other | Admitting: Podiatrist

## 2020-10-10 ENCOUNTER — Other Ambulatory Visit: Payer: Self-pay

## 2020-10-23 ENCOUNTER — Emergency Department (HOSPITAL_COMMUNITY)
Admission: EM | Admit: 2020-10-23 | Discharge: 2020-10-23 | Disposition: A | Discharge status: Home / Self Care | Payer: Medicare Other | Attending: Emergency Medicine | Admitting: Emergency Medicine

## 2020-10-23 ENCOUNTER — Emergency Department (HOSPITAL_COMMUNITY): Payer: Medicare Other

## 2020-10-23 DIAGNOSIS — N185 Chronic kidney disease, stage 5: Secondary | ICD-10-CM | POA: Insufficient documentation

## 2020-10-23 DIAGNOSIS — Z79899 Other long term (current) drug therapy: Secondary | ICD-10-CM | POA: Diagnosis not present

## 2020-10-23 DIAGNOSIS — I120 Hypertensive chronic kidney disease with stage 5 chronic kidney disease or end stage renal disease: Secondary | ICD-10-CM | POA: Diagnosis not present

## 2020-10-23 DIAGNOSIS — W19XXXA Unspecified fall, initial encounter: Secondary | ICD-10-CM | POA: Diagnosis not present

## 2020-10-23 DIAGNOSIS — S59911A Unspecified injury of right forearm, initial encounter: Secondary | ICD-10-CM | POA: Diagnosis present

## 2020-10-23 DIAGNOSIS — Y92002 Bathroom of unspecified non-institutional (private) residence single-family (private) house as the place of occurrence of the external cause: Secondary | ICD-10-CM | POA: Insufficient documentation

## 2020-10-23 DIAGNOSIS — S51811A Laceration without foreign body of right forearm, initial encounter: Secondary | ICD-10-CM | POA: Diagnosis not present

## 2020-10-23 DIAGNOSIS — S0003XA Contusion of scalp, initial encounter: Secondary | ICD-10-CM | POA: Insufficient documentation

## 2020-10-23 DIAGNOSIS — F0390 Unspecified dementia without behavioral disturbance: Secondary | ICD-10-CM | POA: Insufficient documentation

## 2020-10-23 DIAGNOSIS — S0990XA Unspecified injury of head, initial encounter: Secondary | ICD-10-CM

## 2020-10-23 LAB — BASIC METABOLIC PANEL
Anion gap: 13 (ref 5–15)
BUN: 109 mg/dL — ABNORMAL HIGH (ref 8–23)
CO2: 22 mmol/L (ref 22–32)
Calcium: 10.7 mg/dL — ABNORMAL HIGH (ref 8.9–10.3)
Chloride: 104 mmol/L (ref 98–111)
Creatinine, Ser: 4.58 mg/dL — ABNORMAL HIGH (ref 0.61–1.24)
GFR, Estimated: 11 mL/min — ABNORMAL LOW (ref >60)
Glucose, Bld: 121 mg/dL — ABNORMAL HIGH (ref 70–99)
Potassium: 3.7 mmol/L (ref 3.5–5.1)
Sodium: 139 mmol/L (ref 135–145)

## 2020-10-23 LAB — CBC
HCT: 33.9 % — ABNORMAL LOW (ref 39.0–52.0)
Hemoglobin: 11 g/dL — ABNORMAL LOW (ref 13.0–17.0)
MCH: 30.8 pg (ref 26.0–34.0)
MCHC: 32.4 g/dL (ref 30.0–36.0)
MCV: 95 fL (ref 80.0–100.0)
Platelets: 311 10*3/uL (ref 150–400)
RBC: 3.57 MIL/uL — ABNORMAL LOW (ref 4.22–5.81)
RDW: 13.8 % (ref 11.5–15.5)
WBC: 12.2 10*3/uL — ABNORMAL HIGH (ref 4.0–10.5)
nRBC: 0 % (ref 0.0–0.2)

## 2020-10-23 LAB — URINALYSIS, ROUTINE W REFLEX MICROSCOPIC
Bilirubin Urine: NEGATIVE
Glucose, UA: NEGATIVE mg/dL
Ketones, ur: NEGATIVE mg/dL
Leukocytes,Ua: NEGATIVE
Nitrite: NEGATIVE
Protein, ur: NEGATIVE mg/dL
Specific Gravity, Urine: 1.011 (ref 1.005–1.030)
pH: 5 (ref 5.0–8.0)

## 2020-10-23 MED ORDER — LACTATED RINGERS IV BOLUS: 500.0000 mL | Freq: Once | INTRAVENOUS | Status: DC

## 2020-10-23 NOTE — ED Notes (Signed)
Transport called.

## 2020-10-23 NOTE — Discharge Instructions (Addendum)
1.  Continue to work with your family physician for basic health care and management. 2.  Continue to work with Education officer, museum regarding palliative care and additional assistance at your nursing facility.

## 2020-10-23 NOTE — ED Notes (Signed)
Attempted to update pt vitals. Pt will not keep bp cuff or pulseox on. Pt continues to takes everything off and become agitated when tech attempts to reapply.

## 2020-10-23 NOTE — Progress Notes (Signed)
1234 tct-daughter bentia Mcfarland, message left to please call back and discuss any further needs that he might have such as palliative care or snf placement.  [facility] is an assisted living facility without snf.

## 2020-10-23 NOTE — ED Triage Notes (Signed)
[facility]. Pt was found in the bathroom floor, unwitnessed fall. Skin tear to right forearm, bruise to right side of head. Pt acting normal per staff.

## 2020-10-23 NOTE — ED Notes (Signed)
Pt assisted with eating. Pt ate 100% of ham sandwich and 2 ginger ale.

## 2020-10-23 NOTE — Progress Notes (Addendum)
..  Transition of Care [facility]) - Emergency Department Mini Assessment   Patient Details  Name: Jon Snyder MRN: [id number] Date of Birth: [birth year]  [facility] CM/SW Contact:    Illene Regulus, LCSW Phone Number:[phone] 10/23/2020, 12:43 PM   Clinical Narrative: CSW spoke with pt's daughter [name] who is at bedside. Pt's daughter reported she is in the processes of Obtaining  legal Guardianship over her father. She reported he father recently became a long term resident at [facility]. Pt Daughter reported difficulties with communicating with facility, she reported speaking to a nurse tech about wanting to set up palliative care, but no one has returned her calls. CSW attempted to contact [facility], no response left HIPPA VM.    Addendum 1:37pm CSW received call from [name] at [facility], he reported not knowing how pt fell , he stated that pt has had increase confusion over the past week, pt was was able to ambulate on his own prior. CSW informed [name] family is looking to have Palliative services. [name] reported he will let their know MD to put in a order today for Palliative. CSW provided this information to pt's daughter [name]. CSW confirmed pt can return to [facility] upon discharge.    ED Mini Assessment: What brought you to the Emergency Department? : fall                Patient Contact and Communications        ,                 Admission diagnosis:  Fall Patient Active Problem List   Diagnosis Date Noted   Diarrhea 01/06/2017   History of essential hypertension 07/08/2016   PCP:  Thereasa Distance, MD Pharmacy:   [facility], [place] [address] Phone: [phone] Fax: [phone]  [facility] ([facility] - [place], [place] [address] Phone: [phone] Fax: [phone]  [facility] [place], [place] - [place] AT [facility] [address] [place] [address] Phone: [phone] Fax: [phone]

## 2020-10-23 NOTE — ED Provider Notes (Signed)
[facility] DEPT Provider Note   CSN: [id number] Arrival date & time: 10/23/20  2706     History Chief Complaint  Patient presents with   [name]    Jon Snyder is a 87 y.o. male.  HPI Patient with unwitnessed fall at nursing home.  He was found in the bathroom.  A bruise was noted to the right scalp.  Patient had no change in baseline mental status.  Patient has severe dementia at baseline.  Minor skin tear noted to the right forearm no other injury identified at that time.  Patient cannot give any history.  He does have severe dementia.  He has repetitive statements that do not correlate well to H&P.    Past Medical History:  Diagnosis Date   Chronic kidney disease, stage 5, kidney failure ([place])    Dementia ([place])    Hard of hearing     Patient Active Problem List   Diagnosis Date Noted   Diarrhea 01/06/2017   History of essential hypertension 07/08/2016    Past Surgical History:  Procedure Laterality Date   EYE SURGERY Left    2018       No family history on file.  Social History   Tobacco Use   Smoking status: Never   Smokeless tobacco: Never  Substance Use Topics   Alcohol use: No   Drug use: No    Home Medications Prior to Admission medications   Medication Sig Start Date End Date Taking? Authorizing Provider  allopurinol (ZYLOPRIM) 100 MG tablet Take 200 mg by mouth daily.    [provider]  amLODipine (NORVASC) 10 MG tablet Take 10 mg by mouth daily.    [provider]  aspirin EC 81 MG tablet Take 81 mg by mouth as needed for mild pain (or headaches). Swallow whole.    [provider]  Cholecalciferol (VITAMIN D3) 5000 units CAPS Take 5,000 Units by mouth daily.    [provider]  colchicine 0.6 MG tablet Take 0.6-1.2 mg by mouth See admin instructions. Take 1.2 mg by mouth at onset of gout flare (first dose), then 0.6 mg in 1-2 hours after first dose, then stop.    [provider]  dicyclomine (BENTYL) 10 MG capsule Take one twice daily only if needed for diarrhea. Patient not taking: Reported on 08/15/2020 10/01/16   Posey Boyer, MD  ferrous sulfate 325 (65 FE) MG EC tablet Take 325 mg by mouth daily with breakfast.    [provider]  furosemide (LASIX) 20 MG tablet Take 40 mg by mouth 2 (two) times daily.    [provider]  hydrALAZINE (APRESOLINE) 25 MG tablet Take 25 mg by mouth daily.     [provider]  hydrALAZINE (APRESOLINE) 50 MG tablet Take 50 mg by mouth 2 (two) times daily.    [provider]  latanoprost (XALATAN) 0.005 % ophthalmic solution Place 1 drop into both eyes at bedtime.    [provider]  loperamide (IMODIUM) 2 MG capsule Take 1 capsule (2 mg total) by mouth as needed for diarrhea or loose stools. Patient taking differently: Take 2 mg by mouth 4 (four) times daily as needed for diarrhea or loose stools. 01/06/17   Horald Pollen, MD  metoprolol succinate (TOPROL-XL) 50 MG 24 hr tablet Take 50 mg by mouth daily. Take with or immediately following a meal.    [provider]  metoprolol tartrate (LOPRESSOR) 25 MG tablet Take 25 mg  by mouth 2 (two) times daily. Patient not taking: Reported on 08/15/2020    [provider]  olmesartan (BENICAR) 40 MG tablet Take 40 mg by mouth daily. Patient not taking: No sig reported    [provider]  vitamin B-12 (CYANOCOBALAMIN) 1000 MCG tablet Take 1,000 mcg by mouth daily.    [provider]    Allergies    Patient has no known allergies.  Review of Systems   Review of Systems Level 5 caveat cannot obtain review of systems due to dementia Physical Exam Updated Vital Signs There were no vitals taken for this visit.  Physical Exam Constitutional:      Comments: Patient is alert.  He is repeatedly saying I want to go home.  No respiratory distress.  He does not answer any focused questions.  Thin.   HENT:     Head:     Comments: 2 cm hematoma to posterior parietal scalp.  No overlying laceration or abrasion.  No facial trauma.    Nose: Nose normal.     Mouth/Throat:     Pharynx: Oropharynx is clear.  Eyes:     Extraocular Movements: Extraocular movements intact.  Cardiovascular:     Comments: Borderline tachycardia.  No gross rub murmur gallop. Pulmonary:     Effort: Pulmonary effort is normal.     Breath sounds: Normal breath sounds.  Abdominal:     General: There is no distension.     Palpations: Abdomen is soft.     Tenderness: There is no abdominal tenderness.  Musculoskeletal:     Comments: Very minor 1 cm skin tear without bleeding to the volar surface of the right forearm.  Patient spontaneously using both upper extremities moving without difficulty.  I can put both lower extremities through range of motion with flexion extension of the hips and the knees without expression of severe pain.  No evident deformities or significant other hematomas or joint effusions.  Skin:    General: Skin is warm and dry.  Neurological:     Comments: Patient repeats a few select statements.  He does not answer any questions with informative responses.  He does not really follow commands.  He spontaneously moves at will.    ED Results / Procedures / Treatments   Labs (all labs ordered are listed, but only abnormal results are displayed) Labs Reviewed - No data to display  EKG None  Radiology No results found.  Procedures Procedures   Medications Ordered in ED Medications - No data to display  ED Course  I have reviewed the triage vital signs and the nursing notes.  Pertinent labs & imaging results that were available during my care of the patient were reviewed by me and considered in my medical decision making (see chart for details).    MDM Rules/Calculators/A&P                           Patient appears to be at baseline.  No acute findings on diagnostic evaluation.   Patient's daughter reports patient is at normal mental status.  Consultations made for social work to help in long-term care planning. Final Clinical Impression(s) / ED Diagnoses Final diagnoses:  Fall, initial encounter  Injury of head, initial encounter  Chronic renal failure, stage 5 ([place])    Rx / DC Orders ED Discharge Orders     None        Charlesetta Shanks, MD 11/01/20 1538

## 2020-10-29 ENCOUNTER — Encounter (HOSPITAL_COMMUNITY): Payer: Self-pay | Admitting: Emergency Medicine

## 2020-10-29 ENCOUNTER — Other Ambulatory Visit: Payer: Self-pay

## 2020-10-29 ENCOUNTER — Emergency Department (HOSPITAL_COMMUNITY)
Admission: EM | Admit: 2020-10-29 | Discharge: 2020-10-29 | Disposition: A | Discharge status: Home / Self Care | Payer: Medicare Other | Attending: Emergency Medicine | Admitting: Emergency Medicine

## 2020-10-29 DIAGNOSIS — R296 Repeated falls: Secondary | ICD-10-CM | POA: Insufficient documentation

## 2020-10-29 DIAGNOSIS — Z79899 Other long term (current) drug therapy: Secondary | ICD-10-CM | POA: Insufficient documentation

## 2020-10-29 DIAGNOSIS — N185 Chronic kidney disease, stage 5: Secondary | ICD-10-CM | POA: Diagnosis not present

## 2020-10-29 DIAGNOSIS — W19XXXA Unspecified fall, initial encounter: Secondary | ICD-10-CM | POA: Diagnosis not present

## 2020-10-29 DIAGNOSIS — I120 Hypertensive chronic kidney disease with stage 5 chronic kidney disease or end stage renal disease: Secondary | ICD-10-CM | POA: Insufficient documentation

## 2020-10-29 DIAGNOSIS — S0011XA Contusion of right eyelid and periocular area, initial encounter: Secondary | ICD-10-CM | POA: Insufficient documentation

## 2020-10-29 DIAGNOSIS — S0591XA Unspecified injury of right eye and orbit, initial encounter: Secondary | ICD-10-CM | POA: Diagnosis present

## 2020-10-29 DIAGNOSIS — F0390 Unspecified dementia without behavioral disturbance: Secondary | ICD-10-CM | POA: Insufficient documentation

## 2020-10-29 NOTE — Discharge Instructions (Addendum)
Follow-up with your hospice care team at your memory unit.  Return to the ER if your hospice care goals are not able to be met.

## 2020-10-29 NOTE — ED Provider Notes (Signed)
[facility] EMERGENCY DEPARTMENT Provider Note   CSN: [id number] Arrival date & time: 10/29/20  0818     History Chief Complaint  Patient presents with   Fall    No thinners    Jon Snyder is a 87 y.o. male.  Patient with history of dementia presents from his memory unit cortically having a fall last night around 9 PM.  Patient himself unable to give any history as he has severe dementia.  Reportedly has frequent falls within the last several days.  No reports provided of any vomiting or diarrhea or fevers.  I did speak with the patient's daughter with states that he is a DNR patient on hospice care with severe dementia.      Past Medical History:  Diagnosis Date   Chronic kidney disease, stage 5, kidney failure ([place])    Dementia ([place])    Hard of hearing     Patient Active Problem List   Diagnosis Date Noted   Diarrhea 01/06/2017   History of essential hypertension 07/08/2016    Past Surgical History:  Procedure Laterality Date   EYE SURGERY Left    2018       No family history on file.  Social History   Tobacco Use   Smoking status: Never   Smokeless tobacco: Never  Substance Use Topics   Alcohol use: No   Drug use: No    Home Medications Prior to Admission medications   Medication Sig Start Date End Date Taking? Authorizing Provider  allopurinol (ZYLOPRIM) 100 MG tablet Take 200 mg by mouth daily.    [provider]  amLODipine (NORVASC) 10 MG tablet Take 10 mg by mouth daily.    [provider]  aspirin EC 81 MG tablet Take 81 mg by mouth as needed for mild pain (or headaches). Swallow whole.    [provider]  Cholecalciferol (VITAMIN D3) 5000 units CAPS Take 5,000 Units by mouth daily.    [provider]  colchicine 0.6 MG tablet Take 0.6-1.2 mg by mouth See admin instructions. Take 1.2 mg by mouth at onset of gout flare (first dose), then 0.6 mg in 1-2 hours after first dose, then stop.     [provider]  dicyclomine (BENTYL) 10 MG capsule Take one twice daily only if needed for diarrhea. Patient not taking: Reported on 08/15/2020 10/01/16   Posey Boyer, MD  ferrous sulfate 325 (65 FE) MG EC tablet Take 325 mg by mouth daily with breakfast.    [provider]  furosemide (LASIX) 20 MG tablet Take 40 mg by mouth 2 (two) times daily.    [provider]  hydrALAZINE (APRESOLINE) 25 MG tablet Take 25 mg by mouth daily.     [provider]  hydrALAZINE (APRESOLINE) 50 MG tablet Take 50 mg by mouth 2 (two) times daily.    [provider]  latanoprost (XALATAN) 0.005 % ophthalmic solution Place 1 drop into both eyes at bedtime.    [provider]  loperamide (IMODIUM) 2 MG capsule Take 1 capsule (2 mg total) by mouth as needed for diarrhea or loose stools. Patient taking differently: Take 2 mg by mouth 4 (four) times daily as needed for diarrhea or loose stools. 01/06/17   Horald Pollen, MD  metoprolol succinate (TOPROL-XL) 50 MG 24 hr tablet Take 50 mg by mouth daily. Take with or immediately following a meal.    [provider]  metoprolol tartrate (LOPRESSOR) 25 MG tablet  Take 25 mg by mouth 2 (two) times daily. Patient not taking: Reported on 08/15/2020    [provider]  olmesartan (BENICAR) 40 MG tablet Take 40 mg by mouth daily. Patient not taking: No sig reported    [provider]  vitamin B-12 (CYANOCOBALAMIN) 1000 MCG tablet Take 1,000 mcg by mouth daily.    [provider]    Allergies    Patient has no known allergies.  Review of Systems   Review of Systems  Unable to perform ROS: Dementia   Physical Exam Updated Vital Signs BP (!) 143/71 (BP Location: Left Arm)   Pulse (!) 116   Temp 97.8 F (36.6 C) (Oral)   Resp 20   Ht 5\' 5"  (1.651 m)   Wt 65 kg   SpO2 99%   BMI 23.85 kg/m   Physical Exam Constitutional:      Appearance: He is well-developed.  HENT:      Head: Normocephalic.     Nose: Nose normal.  Eyes:     Extraocular Movements: Extraocular movements intact.  Cardiovascular:     Rate and Rhythm: Normal rate.  Pulmonary:     Effort: Pulmonary effort is normal.  Musculoskeletal:     Comments: No C or T or L-spine tenderness noted on exam.  Patient with ranges neck without pain.  Patient able to range her shoulders elbows wrists and hips knees and ankles without pain or discomfort.  Skin:    Coloration: Skin is not jaundiced.     Comments: Bruising seen around the right eyelid.  No facial deformity noted.  Neurological:     Mental Status: He is alert. Mental status is at baseline.    ED Results / Procedures / Treatments   Labs (all labs ordered are listed, but only abnormal results are displayed) Labs Reviewed - No data to display  EKG None  Radiology No results found.  Procedures Procedures   Medications Ordered in ED Medications - No data to display  ED Course  I have reviewed the triage vital signs and the nursing notes.  Pertinent labs & imaging results that were available during my care of the patient were reviewed by me and considered in my medical decision making (see chart for details).    MDM Rules/Calculators/A&P                           Patient is lying quietly on a stretcher in no acute distress.  Discussed his presentation with his daughter who would like him to pursue comfort care hospice care.  No additional testing pursued here patient appears comfortable.  Will be discharged back to his facility to continue hospice care.  Final Clinical Impression(s) / ED Diagnoses Final diagnoses:  Fall, initial encounter    Rx / DC Orders ED Discharge Orders     None        [place], Greggory Brandy, MD 10/29/20 [phone]

## 2020-10-29 NOTE — ED Triage Notes (Signed)
Pt BIB [facility] from [facility] for repeated falls. Pt is a Hospice pt, DNR at bedside. Pt had 2 falls yesterday, suspected 1 this AM. Pt found on floor near bed. Pt seen by Hospice RN yesterday, said OK not to send out, staff said today the pt was falling too much. Pt has swelling to R eye from fall yesterday. Skin tear to R elbow. Alert to baseline.   EMS VS- 140/70, 86 HR, 16 RR, 97% RA, CBG 128

## 2020-10-29 NOTE — ED Notes (Signed)
Pt verbalizes understanding of discharge instructions. Opportunity for questions and answers were provided. Pt discharged from the ED to [facility] via [place].

## 2020-10-29 NOTE — ED Notes (Signed)
Attempted report to [facility] x3. No answer x3.

## 2020-10-29 NOTE — ED Notes (Signed)
Patient refused to let me put a Gown on him.

## 2020-11-01 ENCOUNTER — Other Ambulatory Visit: Payer: Self-pay

## 2020-11-01 ENCOUNTER — Encounter (HOSPITAL_COMMUNITY): Payer: Self-pay

## 2020-11-01 ENCOUNTER — Emergency Department (HOSPITAL_COMMUNITY)
Admission: EM | Admit: 2020-11-01 | Discharge: 2020-11-01 | Disposition: A | Discharge status: Home / Self Care | Attending: Emergency Medicine | Admitting: Emergency Medicine

## 2020-11-01 DIAGNOSIS — S01412A Laceration without foreign body of left cheek and temporomandibular area, initial encounter: Secondary | ICD-10-CM | POA: Diagnosis not present

## 2020-11-01 DIAGNOSIS — S5011XA Contusion of right forearm, initial encounter: Secondary | ICD-10-CM | POA: Diagnosis not present

## 2020-11-01 DIAGNOSIS — Z7982 Long term (current) use of aspirin: Secondary | ICD-10-CM | POA: Diagnosis not present

## 2020-11-01 DIAGNOSIS — Z79899 Other long term (current) drug therapy: Secondary | ICD-10-CM | POA: Diagnosis not present

## 2020-11-01 DIAGNOSIS — I120 Hypertensive chronic kidney disease with stage 5 chronic kidney disease or end stage renal disease: Secondary | ICD-10-CM | POA: Insufficient documentation

## 2020-11-01 DIAGNOSIS — N185 Chronic kidney disease, stage 5: Secondary | ICD-10-CM | POA: Insufficient documentation

## 2020-11-01 DIAGNOSIS — Z23 Encounter for immunization: Secondary | ICD-10-CM | POA: Diagnosis not present

## 2020-11-01 DIAGNOSIS — W050XXA Fall from non-moving wheelchair, initial encounter: Secondary | ICD-10-CM

## 2020-11-01 DIAGNOSIS — F0390 Unspecified dementia without behavioral disturbance: Secondary | ICD-10-CM | POA: Diagnosis not present

## 2020-11-01 DIAGNOSIS — S0081XA Abrasion of other part of head, initial encounter: Secondary | ICD-10-CM

## 2020-11-01 DIAGNOSIS — S0083XA Contusion of other part of head, initial encounter: Secondary | ICD-10-CM

## 2020-11-01 DIAGNOSIS — S0993XA Unspecified injury of face, initial encounter: Secondary | ICD-10-CM | POA: Diagnosis present

## 2020-11-01 MED ORDER — TETANUS-DIPHTH-ACELL PERTUSSIS 5-2.5-18.5 LF-MCG/0.5 IM SUSY
0.5000 mL | PREFILLED_SYRINGE | Freq: Once | INTRAMUSCULAR | Status: AC
  Administered 2020-11-01: 0.5 mL via INTRAMUSCULAR
  Filled 2020-11-01: qty 0.5

## 2020-11-01 MED ORDER — BACITRACIN ZINC 500 UNIT/GM EX OINT
TOPICAL_OINTMENT | Freq: Once | CUTANEOUS | Status: AC
  Administered 2020-11-01: 1 via TOPICAL
  Filled 2020-11-01: qty 0.9

## 2020-11-01 NOTE — Discharge Instructions (Addendum)
It was our pleasure to provide your ER care today - we hope that you feel better.  Fall precautions.   Keep abrasion very clean - wash with soap and water 1-2x/day. You may apply thin coat of bacitracin for the next 3-4 days.   Follow up with primary care doctor.  Return to ER if worse, new symptoms, new/severe pain, trouble breathing, or other emergency concern.

## 2020-11-01 NOTE — ED Notes (Signed)
RN updated pt daughter [name] re: EMS transport to ED, no questions

## 2020-11-01 NOTE — ED Provider Notes (Signed)
[facility] EMERGENCY DEPARTMENT Provider Note   CSN: [id number] Arrival date & time: 11/01/20  0814     History Chief Complaint  Patient presents with   [name]    Jon Snyder is a 87 y.o. male.  Patient presents via EMS s/p fall from wheelchair at [facility]. Pt at baseline w hx dementia, fall risk, and is DNR and hospice patient.   No report of LOC, and mental status noted c/w baseline post fall. No vomiting. +abrasion to face. Tetanus unknown. Level 5 caveat, dementia - pt limited historian.   The history is provided by the patient, the EMS personnel and medical records. The history is limited by the condition of the patient.  Fall      Past Medical History:  Diagnosis Date   Chronic kidney disease, stage 5, kidney failure ([place])    Dementia ([place])    Hard of hearing     Patient Active Problem List   Diagnosis Date Noted   Diarrhea 01/06/2017   History of essential hypertension 07/08/2016    Past Surgical History:  Procedure Laterality Date   EYE SURGERY Left    2018       History reviewed. No pertinent family history.  Social History   Tobacco Use   Smoking status: Never   Smokeless tobacco: Never  Substance Use Topics   Alcohol use: No   Drug use: No    Home Medications Prior to Admission medications   Medication Sig Start Date End Date Taking? Authorizing Provider  allopurinol (ZYLOPRIM) 100 MG tablet Take 200 mg by mouth daily.    [provider]  amLODipine (NORVASC) 10 MG tablet Take 10 mg by mouth daily.    [provider]  aspirin EC 81 MG tablet Take 81 mg by mouth as needed for mild pain (or headaches). Swallow whole.    [provider]  Cholecalciferol (VITAMIN D3) 5000 units CAPS Take 5,000 Units by mouth daily.    [provider]  colchicine 0.6 MG tablet Take 0.6-1.2 mg by mouth See admin instructions. Take 1.2 mg by mouth at onset of gout flare (first dose), then 0.6 mg in 1-2 hours after first  dose, then stop.    [provider]  dicyclomine (BENTYL) 10 MG capsule Take one twice daily only if needed for diarrhea. Patient not taking: Reported on 08/15/2020 10/01/16   Posey Boyer, MD  ferrous sulfate 325 (65 FE) MG EC tablet Take 325 mg by mouth daily with breakfast.    [provider]  furosemide (LASIX) 20 MG tablet Take 40 mg by mouth 2 (two) times daily.    [provider]  hydrALAZINE (APRESOLINE) 25 MG tablet Take 25 mg by mouth daily.     [provider]  hydrALAZINE (APRESOLINE) 50 MG tablet Take 50 mg by mouth 2 (two) times daily.    [provider]  latanoprost (XALATAN) 0.005 % ophthalmic solution Place 1 drop into both eyes at bedtime.    [provider]  loperamide (IMODIUM) 2 MG capsule Take 1 capsule (2 mg total) by mouth as needed for diarrhea or loose stools. Patient taking differently: Take 2 mg by mouth 4 (four) times daily as needed for diarrhea or loose stools. 01/06/17   Horald Pollen, MD  metoprolol succinate (TOPROL-XL) 50 MG 24 hr tablet Take 50 mg by mouth daily. Take with or immediately following a meal.    [provider]  metoprolol tartrate (LOPRESSOR) 25 MG  tablet Take 25 mg by mouth 2 (two) times daily. Patient not taking: Reported on 08/15/2020    [provider]  olmesartan (BENICAR) 40 MG tablet Take 40 mg by mouth daily. Patient not taking: No sig reported    [provider]  vitamin B-12 (CYANOCOBALAMIN) 1000 MCG tablet Take 1,000 mcg by mouth daily.    [provider]    Allergies    Patient has no known allergies.  Review of Systems   Review of Systems  Unable to perform ROS: Dementia  Level 5 caveat - dementia - pt unable to perform ros  Physical Exam Updated Vital Signs BP (!) 102/47   Pulse 83   Temp (!) 97.4 F (36.3 C) (Oral)   Resp 11   Ht 1.651 m (5\' 5" )   Wt 65 kg   SpO2 99%   BMI 23.85 kg/m   Physical Exam Vitals and nursing  note reviewed.  Constitutional:      Appearance: Normal appearance. He is well-developed.  HENT:     Head:     Comments: Old bruising to right forearm/right face. Abrasion/skin tear to left cheek. Facial bones/orbits appear grossly intact. No nasal septal hematoma. No malocclusion.     Nose: Nose normal.     Mouth/Throat:     Mouth: Mucous membranes are moist.     Pharynx: Oropharynx is clear.  Eyes:     General: No scleral icterus.    Conjunctiva/sclera: Conjunctivae normal.     Pupils: Pupils are equal, round, and reactive to light.  Neck:     Trachea: No tracheal deviation.  Cardiovascular:     Rate and Rhythm: Normal rate and regular rhythm.     Pulses: Normal pulses.     Heart sounds: Normal heart sounds. No murmur heard.   No friction rub. No gallop.  Pulmonary:     Effort: Pulmonary effort is normal. No accessory muscle usage or respiratory distress.     Breath sounds: Normal breath sounds.  Chest:     Chest wall: No tenderness.  Abdominal:     General: Bowel sounds are normal. There is no distension.     Palpations: Abdomen is soft.     Tenderness: There is no abdominal tenderness.     Comments: No abd contusion, bruising, or tenderness.   Genitourinary:    Comments: No cva tenderness. Musculoskeletal:        General: No swelling or tenderness.     Cervical back: Normal range of motion and neck supple. No rigidity or tenderness.     Comments: CTLS spine, non tender, aligned, no step off. Good rom bil extremities without pain or focal bony tenderness.   Skin:    General: Skin is warm and dry.     Findings: No rash.  Neurological:     Mental Status: He is alert.     Comments: Alert, content appearing. Moves bil extremities purposefully. Mental status reported as c/w baseline.   Psychiatric:        Mood and Affect: Mood normal.    ED Results / Procedures / Treatments   Labs (all labs ordered are listed, but only abnormal results are displayed) Results for  orders placed or performed during the hospital encounter of 10/23/20  Urinalysis, Routine w reflex microscopic Urine, Clean Catch  Result Value Ref Range   Color, Urine STRAW (A) YELLOW   APPearance CLEAR CLEAR   Specific Gravity, Urine 1.011 1.005 - 1.030   pH 5.0 5.0 -  8.0   Glucose, UA NEGATIVE NEGATIVE mg/dL   Hgb urine dipstick LARGE (A) NEGATIVE   Bilirubin Urine NEGATIVE NEGATIVE   Ketones, ur NEGATIVE NEGATIVE mg/dL   Protein, ur NEGATIVE NEGATIVE mg/dL   Nitrite NEGATIVE NEGATIVE   Leukocytes,Ua NEGATIVE NEGATIVE   RBC / HPF 0-5 0 - 5 RBC/hpf   WBC, UA 0-5 0 - 5 WBC/hpf   Bacteria, UA RARE (A) NONE SEEN   Mucus PRESENT    Hyaline Casts, UA PRESENT   Basic metabolic panel  Result Value Ref Range   Sodium 139 135 - 145 mmol/L   Potassium 3.7 3.5 - 5.1 mmol/L   Chloride 104 98 - 111 mmol/L   CO2 22 22 - 32 mmol/L   Glucose, Bld 121 (H) 70 - 99 mg/dL   BUN 109 (H) 8 - 23 mg/dL   Creatinine, Ser 4.58 (H) 0.61 - 1.24 mg/dL   Calcium 10.7 (H) 8.9 - 10.3 mg/dL   GFR, Estimated 11 (L) >60 mL/min   Anion gap 13 5 - 15  CBC  Result Value Ref Range   WBC 12.2 (H) 4.0 - 10.5 K/uL   RBC 3.57 (L) 4.22 - 5.81 MIL/uL   Hemoglobin 11.0 (L) 13.0 - 17.0 g/dL   HCT 33.9 (L) 39.0 - 52.0 %   MCV 95.0 80.0 - 100.0 fL   MCH 30.8 26.0 - 34.0 pg   MCHC 32.4 30.0 - 36.0 g/dL   RDW 13.8 11.5 - 15.5 %   Platelets 311 150 - 400 K/uL   nRBC 0.0 0.0 - 0.2 %   CT Head Wo Contrast  Result Date: 10/23/2020 CLINICAL DATA:  Minor head trauma, unwitnessed fall, found on floor, history dementia and stage 5 chronic kidney disease EXAM: CT HEAD WITHOUT CONTRAST TECHNIQUE: Contiguous axial images were obtained from the base of the skull through the vertex without intravenous contrast. Sagittal and coronal MPR images reconstructed from axial data set. Motion artifacts on initial images, for which repeat imaging was performed. COMPARISON:  08/19/2013 FINDINGS: Brain: Mild generalized atrophy. Normal  ventricular morphology. No midline shift or mass effect. Otherwise normal appearance of brain parenchyma. No intracranial hemorrhage, mass lesion, or acute infarction. No extra-axial fluid collections. Vascular: No hyperdense vessels Skull: Intact Sinuses/Orbits: Clear Other: N/A IMPRESSION: Generalized atrophy. No acute intracranial abnormalities. Electronically Signed   By: Lavonia Dana M.D.   On: 10/23/2020 11:10   DG Pelvis Portable  Result Date: 10/23/2020 CLINICAL DATA:  Unwitnessed fall, found on floor EXAM: PORTABLE PELVIS 1-2 VIEWS COMPARISON:  Portable exam 0956 hours without priors for comparison FINDINGS: Osseous demineralization. Hip and SI joint spaces preserved. Proximal femora intact. No pelvic fracture or dislocation identified. Degenerative disc and facet disease changes of visualized lower lumbar spine. IMPRESSION: No acute osseous abnormalities. Electronically Signed   By: Lavonia Dana M.D.   On: 10/23/2020 10:39   DG Chest Port 1 View  Result Date: 10/23/2020 CLINICAL DATA:  Unwitnessed fall, found on floor EXAM: PORTABLE CHEST 1 VIEW COMPARISON:  Portable exam 0954 hours compared to 08/14/2020 FINDINGS: Rotated to the RIGHT. Normal heart size, mediastinal contours, and pulmonary vascularity. Minimal bibasilar atelectasis. Lungs clear. No pulmonary infiltrate, pleural effusion, or pneumothorax. Bones demineralized. IMPRESSION: Bibasilar atelectasis. Electronically Signed   By: Lavonia Dana M.D.   On: 10/23/2020 10:38    EKG None  Procedures Procedures   Medications Ordered in ED Medications  Tdap (BOOSTRIX) injection 0.5 mL (0.5 mLs Intramuscular Given 11/01/20 1008)  bacitracin ointment (1 application  Topical Given 11/01/20 1008)    ED Course  I have reviewed the triage vital signs and the nursing notes.  Pertinent labs & imaging results that were available during my care of the patient were reviewed by me and considered in my medical decision making (see chart for  details).    MDM Rules/Calculators/A&P                           Reviewed nursing notes and prior charts for additional history.  Pt with recent head ct - reviewed - neg acute then. Also w recent labs c/w baseline.   Abrasion/skin tear cleaned, bacitracin applied. Tetanus updated - tetanus IM given.  Recheck, pt appears comfortable, no distress. [facility] patient. No focal bony tenderness on exam.   Pt currently appears stable for d/c.   Fall precautions.    Final Clinical Impression(s) / ED Diagnoses Final diagnoses:  None    Rx / DC Orders ED Discharge Orders     None        Lajean Saver, MD 11/01/20 1044

## 2020-11-01 NOTE — ED Triage Notes (Signed)
BIB medic for witnessed fall from w/c. Hospice pt from [facility], DNR at bedside. Skin tear to L cheek, old bruising from prior fall noted

## 2020-11-01 NOTE — ED Notes (Signed)
BIB by medic for fall. RN updated daughter [name]. VSS. Dr Ashok Cordia at bedside assessing pt.

## 2020-12-05 DEATH — deceased

## 2022-10-19 IMAGING — DX DG PORTABLE PELVIS
1 series · 1 of 1 positions shown · non-contrast
Comparison: Portable exam 7191 hours without priors for comparison

CLINICAL DATA: Unwitnessed fall, found on floor

EXAM:
PORTABLE PELVIS 1-2 VIEWS

[pelvis ap]
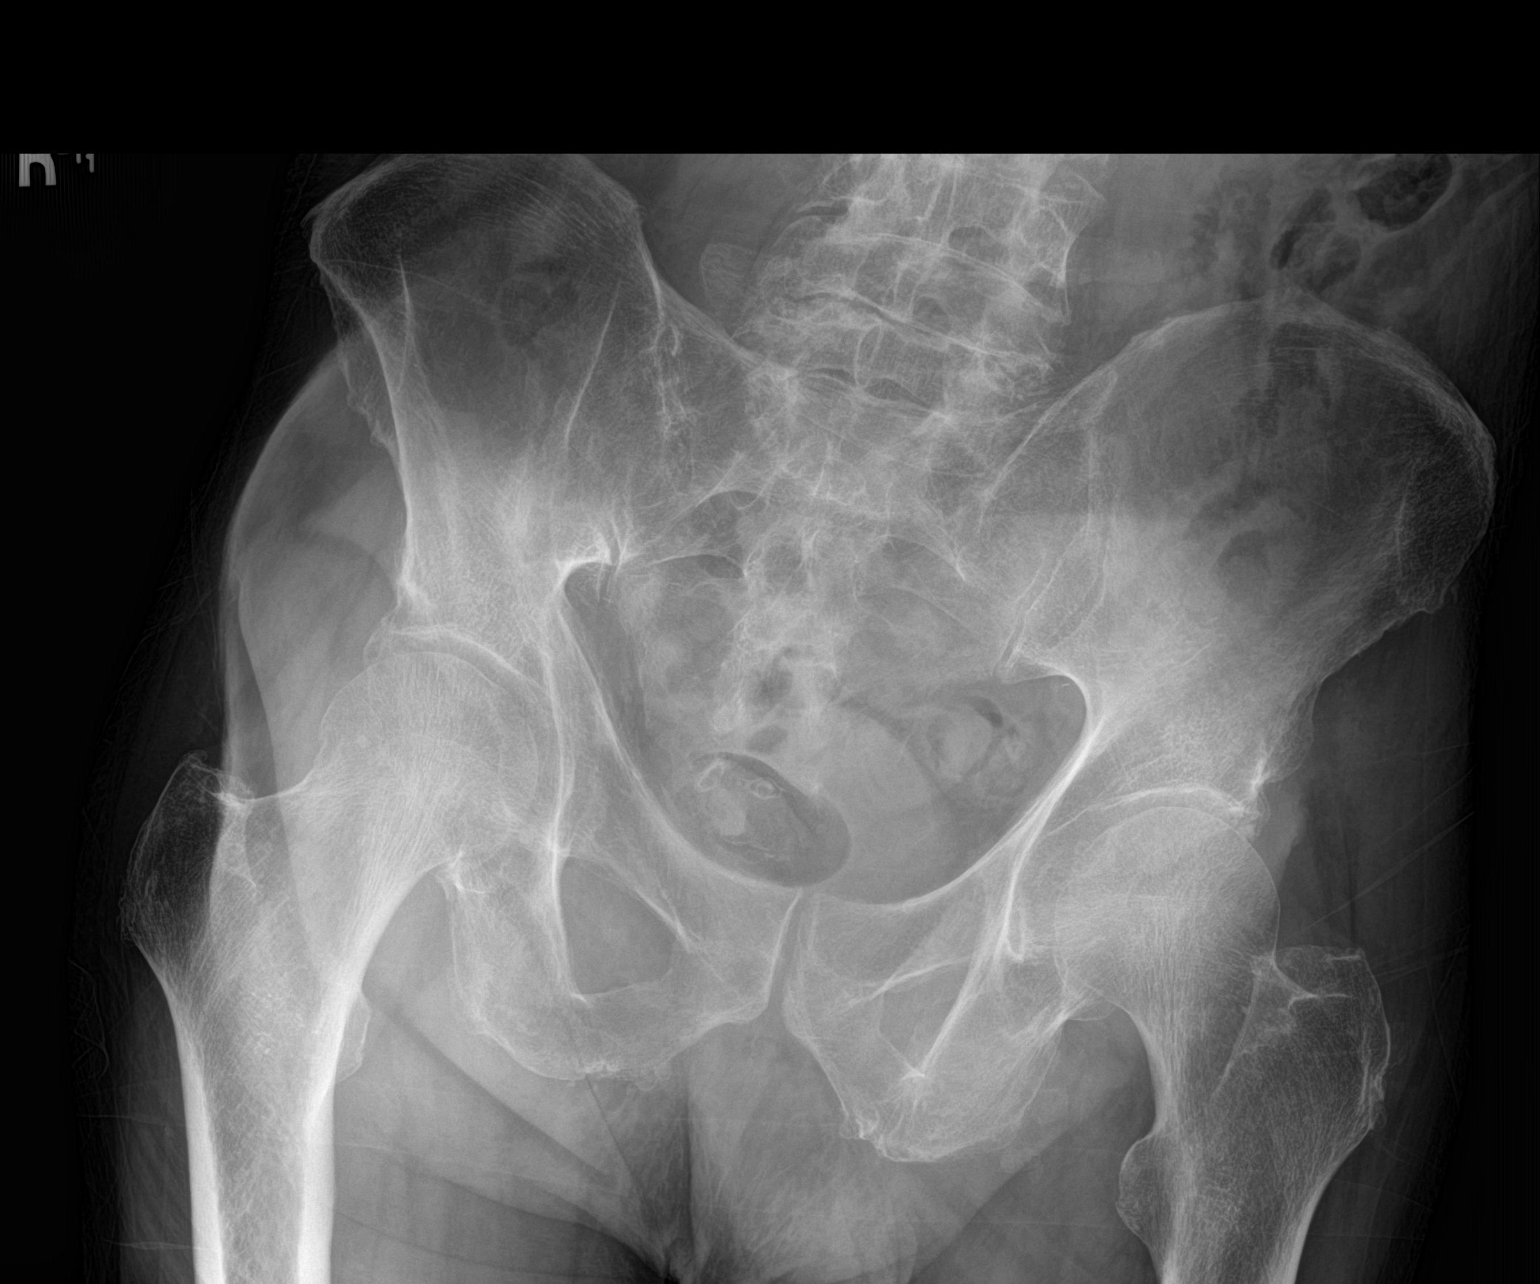

[1 of 1 positions shown; findings below may reference images not displayed]

FINDINGS: Osseous demineralization.

Hip and SI joint spaces preserved.

Proximal femora intact.

No pelvic fracture or dislocation identified.

Degenerative disc and facet disease changes of visualized lower
lumbar spine.
IMPRESSION: No acute osseous abnormalities.

## 2022-10-19 IMAGING — DX DG CHEST 1V PORT
1 series · 1 of 1 positions shown · non-contrast
Comparison: Portable exam 6616 hours compared to 08/14/2020

CLINICAL DATA: Unwitnessed fall, found on floor

EXAM:
PORTABLE CHEST 1 VIEW

[chest ap]
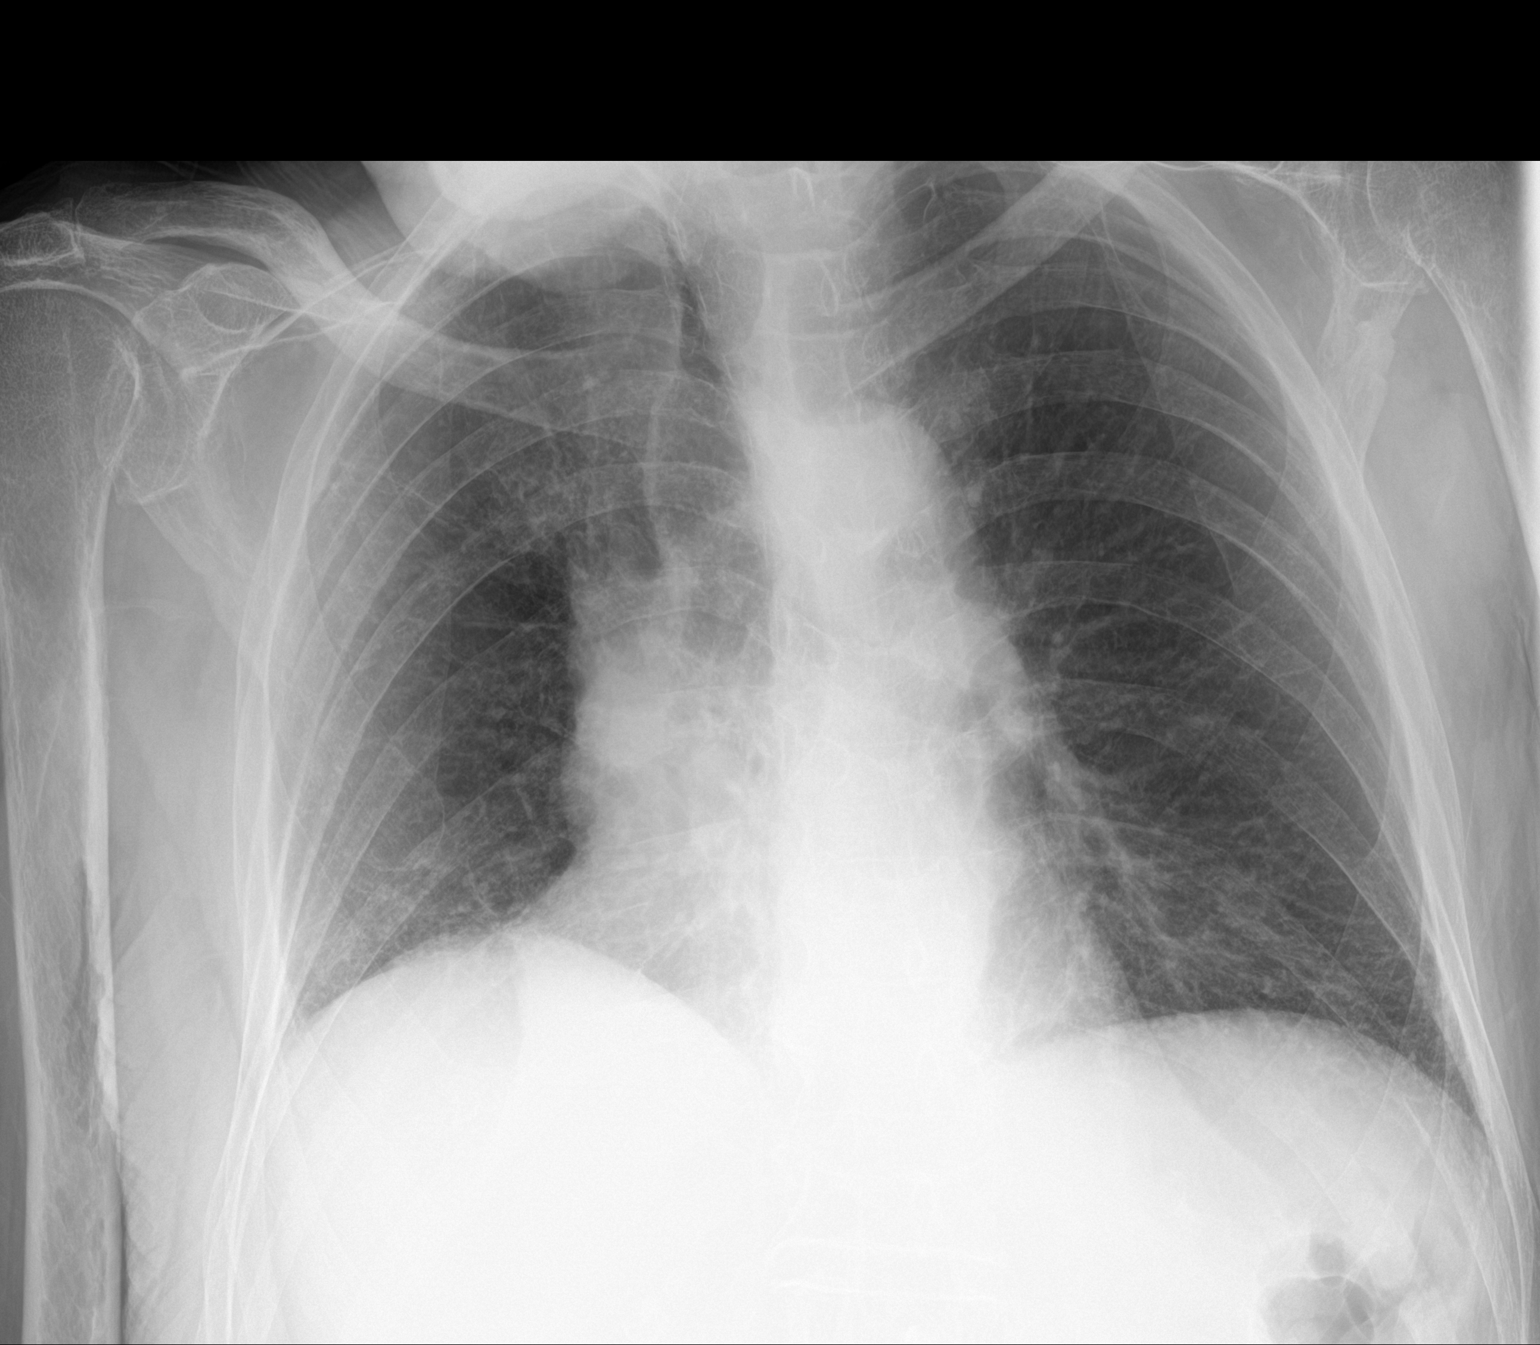

[1 of 1 positions shown; findings below may reference images not displayed]

FINDINGS: Rotated to the RIGHT.

Normal heart size, mediastinal contours, and pulmonary vascularity.

Minimal bibasilar atelectasis.

Lungs clear.

No pulmonary infiltrate, pleural effusion, or pneumothorax.

Bones demineralized.
IMPRESSION: Bibasilar atelectasis.
